# Patient Record
Sex: Female | Born: 1970 | Race: Black or African American | Hispanic: No | Smoking: Never smoker
Health system: Southern US, Community
[De-identification: ages and names within clinical notes are randomized; demographics above are authoritative.]

## PROBLEM LIST (undated history)

## (undated) DIAGNOSIS — M79606 Pain in leg, unspecified: Secondary | ICD-10-CM

## (undated) DIAGNOSIS — F32A Depression, unspecified: Secondary | ICD-10-CM

## (undated) DIAGNOSIS — N6082 Other benign mammary dysplasias of left breast: Secondary | ICD-10-CM

## (undated) DIAGNOSIS — M549 Dorsalgia, unspecified: Secondary | ICD-10-CM

## (undated) DIAGNOSIS — F419 Anxiety disorder, unspecified: Secondary | ICD-10-CM

## (undated) DIAGNOSIS — E010 Iodine-deficiency related diffuse (endemic) goiter: Secondary | ICD-10-CM

## (undated) DIAGNOSIS — R0602 Shortness of breath: Secondary | ICD-10-CM

## (undated) DIAGNOSIS — M7989 Other specified soft tissue disorders: Secondary | ICD-10-CM

## (undated) DIAGNOSIS — R0989 Other specified symptoms and signs involving the circulatory and respiratory systems: Secondary | ICD-10-CM

## (undated) DIAGNOSIS — M069 Rheumatoid arthritis, unspecified: Secondary | ICD-10-CM

## (undated) DIAGNOSIS — E739 Lactose intolerance, unspecified: Secondary | ICD-10-CM

## (undated) DIAGNOSIS — R5383 Other fatigue: Secondary | ICD-10-CM

## (undated) DIAGNOSIS — R413 Other amnesia: Secondary | ICD-10-CM

## (undated) DIAGNOSIS — F329 Major depressive disorder, single episode, unspecified: Secondary | ICD-10-CM

## (undated) DIAGNOSIS — I10 Essential (primary) hypertension: Secondary | ICD-10-CM

## (undated) DIAGNOSIS — D649 Anemia, unspecified: Secondary | ICD-10-CM

## (undated) DIAGNOSIS — M545 Low back pain, unspecified: Secondary | ICD-10-CM

## (undated) DIAGNOSIS — R7303 Prediabetes: Secondary | ICD-10-CM

## (undated) HISTORY — DX: Low back pain, unspecified: M54.50

## (undated) HISTORY — DX: Lactose intolerance, unspecified: E73.9

## (undated) HISTORY — DX: Iodine-deficiency related diffuse (endemic) goiter: E01.0

## (undated) HISTORY — DX: Dorsalgia, unspecified: M54.9

## (undated) HISTORY — DX: Other specified symptoms and signs involving the circulatory and respiratory systems: R09.89

## (undated) HISTORY — DX: Other fatigue: R53.83

## (undated) HISTORY — DX: Anxiety disorder, unspecified: F41.9

## (undated) HISTORY — DX: Other specified soft tissue disorders: M79.89

## (undated) HISTORY — PX: BACK SURGERY: SHX140

## (undated) HISTORY — DX: Rheumatoid arthritis, unspecified: M06.9

## (undated) HISTORY — DX: Shortness of breath: R06.02

## (undated) HISTORY — DX: Other amnesia: R41.3

## (undated) HISTORY — DX: Essential (primary) hypertension: I10

## (undated) HISTORY — DX: Pain in leg, unspecified: M79.606

---

## 1998-07-19 ENCOUNTER — Inpatient Hospital Stay (HOSPITAL_COMMUNITY): Admission: AD | Admit: 1998-07-19 | Discharge: 1998-07-19 | Payer: Self-pay | Admitting: Obstetrics and Gynecology

## 1998-07-25 ENCOUNTER — Inpatient Hospital Stay (HOSPITAL_COMMUNITY): Admission: AD | Admit: 1998-07-25 | Discharge: 1998-07-25 | Payer: Self-pay | Admitting: Obstetrics and Gynecology

## 1998-07-27 ENCOUNTER — Inpatient Hospital Stay (HOSPITAL_COMMUNITY): Admission: AD | Admit: 1998-07-27 | Discharge: 1998-07-27 | Payer: Self-pay | Admitting: Obstetrics and Gynecology

## 1998-08-21 ENCOUNTER — Inpatient Hospital Stay (HOSPITAL_COMMUNITY): Admission: AD | Admit: 1998-08-21 | Discharge: 1998-08-23 | Payer: Self-pay | Admitting: Obstetrics and Gynecology

## 1998-08-23 ENCOUNTER — Encounter (HOSPITAL_COMMUNITY): Admission: RE | Admit: 1998-08-23 | Discharge: 1998-11-21 | Payer: Self-pay | Admitting: Obstetrics and Gynecology

## 1998-08-26 ENCOUNTER — Ambulatory Visit (HOSPITAL_COMMUNITY): Admission: RE | Admit: 1998-08-26 | Discharge: 1998-08-26 | Payer: Self-pay | Admitting: Obstetrics and Gynecology

## 1998-09-24 ENCOUNTER — Other Ambulatory Visit: Admission: RE | Admit: 1998-09-24 | Discharge: 1998-09-24 | Payer: Self-pay | Admitting: Obstetrics and Gynecology

## 1998-11-24 ENCOUNTER — Encounter (HOSPITAL_COMMUNITY): Admission: RE | Admit: 1998-11-24 | Discharge: 1999-02-22 | Payer: Self-pay

## 2000-05-10 ENCOUNTER — Other Ambulatory Visit: Admission: RE | Admit: 2000-05-10 | Discharge: 2000-05-10 | Payer: Self-pay | Admitting: Obstetrics and Gynecology

## 2001-03-13 ENCOUNTER — Other Ambulatory Visit: Admission: RE | Admit: 2001-03-13 | Discharge: 2001-03-13 | Payer: Self-pay | Admitting: Obstetrics and Gynecology

## 2001-10-11 ENCOUNTER — Inpatient Hospital Stay (HOSPITAL_COMMUNITY): Admission: AD | Admit: 2001-10-11 | Discharge: 2001-10-13 | Payer: Self-pay | Admitting: Obstetrics and Gynecology

## 2002-01-15 ENCOUNTER — Other Ambulatory Visit: Admission: RE | Admit: 2002-01-15 | Discharge: 2002-01-15 | Payer: Self-pay | Admitting: Obstetrics and Gynecology

## 2003-04-14 ENCOUNTER — Other Ambulatory Visit: Admission: RE | Admit: 2003-04-14 | Discharge: 2003-04-14 | Payer: Self-pay | Admitting: Obstetrics and Gynecology

## 2004-07-05 ENCOUNTER — Other Ambulatory Visit: Admission: RE | Admit: 2004-07-05 | Discharge: 2004-07-05 | Payer: Self-pay | Admitting: Obstetrics and Gynecology

## 2005-10-09 ENCOUNTER — Other Ambulatory Visit: Admission: RE | Admit: 2005-10-09 | Discharge: 2005-10-09 | Payer: Self-pay | Admitting: Obstetrics and Gynecology

## 2006-09-01 ENCOUNTER — Emergency Department (HOSPITAL_COMMUNITY): Admission: EM | Admit: 2006-09-01 | Discharge: 2006-09-02 | Payer: Self-pay | Admitting: Emergency Medicine

## 2009-04-12 ENCOUNTER — Ambulatory Visit: Payer: Self-pay | Admitting: Vascular Surgery

## 2009-04-12 ENCOUNTER — Ambulatory Visit (HOSPITAL_COMMUNITY): Admission: RE | Admit: 2009-04-12 | Discharge: 2009-04-12 | Payer: Self-pay | Admitting: Obstetrics and Gynecology

## 2009-04-12 ENCOUNTER — Encounter (INDEPENDENT_AMBULATORY_CARE_PROVIDER_SITE_OTHER): Payer: Self-pay | Admitting: Obstetrics and Gynecology

## 2011-07-26 ENCOUNTER — Inpatient Hospital Stay (INDEPENDENT_AMBULATORY_CARE_PROVIDER_SITE_OTHER)
Admission: RE | Admit: 2011-07-26 | Discharge: 2011-07-26 | Disposition: A | Discharge status: Home / Self Care | Payer: BC Managed Care – PPO | Source: Ambulatory Visit | Attending: Family Medicine | Admitting: Family Medicine

## 2011-07-26 ENCOUNTER — Ambulatory Visit (INDEPENDENT_AMBULATORY_CARE_PROVIDER_SITE_OTHER): Payer: BC Managed Care – PPO

## 2011-07-26 DIAGNOSIS — S93409A Sprain of unspecified ligament of unspecified ankle, initial encounter: Secondary | ICD-10-CM

## 2013-04-24 ENCOUNTER — Other Ambulatory Visit: Payer: Self-pay | Admitting: Family Medicine

## 2013-04-24 DIAGNOSIS — E049 Nontoxic goiter, unspecified: Secondary | ICD-10-CM

## 2013-04-25 ENCOUNTER — Ambulatory Visit
Admission: RE | Admit: 2013-04-25 | Discharge: 2013-04-25 | Disposition: A | Discharge status: Home / Self Care | Payer: BC Managed Care – PPO | Source: Ambulatory Visit | Attending: Family Medicine | Admitting: Family Medicine

## 2013-04-25 ENCOUNTER — Other Ambulatory Visit: Payer: BC Managed Care – PPO

## 2013-04-25 DIAGNOSIS — E049 Nontoxic goiter, unspecified: Secondary | ICD-10-CM

## 2013-05-06 ENCOUNTER — Other Ambulatory Visit: Payer: Self-pay | Admitting: *Deleted

## 2013-05-06 ENCOUNTER — Encounter: Payer: Self-pay | Admitting: Surgery

## 2013-05-06 DIAGNOSIS — M79609 Pain in unspecified limb: Secondary | ICD-10-CM

## 2013-05-23 ENCOUNTER — Encounter: Payer: Self-pay | Admitting: Surgery

## 2013-05-26 ENCOUNTER — Encounter: Payer: BC Managed Care – PPO | Admitting: Surgery

## 2017-02-01 ENCOUNTER — Ambulatory Visit
Admission: RE | Admit: 2017-02-01 | Discharge: 2017-02-01 | Disposition: A | Discharge status: Home / Self Care | Payer: BC Managed Care – PPO | Source: Ambulatory Visit | Attending: Physician Assistant | Admitting: Physician Assistant

## 2017-02-01 ENCOUNTER — Other Ambulatory Visit: Payer: Self-pay | Admitting: Physician Assistant

## 2017-02-01 DIAGNOSIS — R079 Chest pain, unspecified: Secondary | ICD-10-CM

## 2017-07-25 ENCOUNTER — Other Ambulatory Visit: Payer: Self-pay | Admitting: Family Medicine

## 2017-07-25 DIAGNOSIS — M48061 Spinal stenosis, lumbar region without neurogenic claudication: Secondary | ICD-10-CM

## 2017-08-02 ENCOUNTER — Ambulatory Visit
Admission: RE | Admit: 2017-08-02 | Discharge: 2017-08-02 | Disposition: A | Discharge status: Home / Self Care | Payer: BC Managed Care – PPO | Source: Ambulatory Visit | Attending: Family Medicine | Admitting: Family Medicine

## 2017-08-02 DIAGNOSIS — M48061 Spinal stenosis, lumbar region without neurogenic claudication: Secondary | ICD-10-CM

## 2017-08-04 ENCOUNTER — Other Ambulatory Visit: Payer: BC Managed Care – PPO

## 2018-06-05 ENCOUNTER — Other Ambulatory Visit: Payer: Self-pay | Admitting: General Surgery

## 2018-06-21 ENCOUNTER — Encounter (HOSPITAL_BASED_OUTPATIENT_CLINIC_OR_DEPARTMENT_OTHER): Payer: Self-pay | Admitting: *Deleted

## 2018-06-21 ENCOUNTER — Other Ambulatory Visit: Payer: Self-pay

## 2018-06-26 ENCOUNTER — Encounter (HOSPITAL_BASED_OUTPATIENT_CLINIC_OR_DEPARTMENT_OTHER)
Admission: RE | Admit: 2018-06-26 | Discharge: 2018-06-26 | Disposition: A | Discharge status: Home / Self Care | Payer: BC Managed Care – PPO | Source: Ambulatory Visit | Attending: General Surgery | Admitting: General Surgery

## 2018-06-26 ENCOUNTER — Other Ambulatory Visit: Payer: Self-pay

## 2018-06-26 DIAGNOSIS — Z01810 Encounter for preprocedural cardiovascular examination: Secondary | ICD-10-CM | POA: Insufficient documentation

## 2018-06-26 DIAGNOSIS — Z01812 Encounter for preprocedural laboratory examination: Secondary | ICD-10-CM | POA: Diagnosis present

## 2018-06-26 LAB — BASIC METABOLIC PANEL
Anion gap: 8 (ref 5–15)
BUN: 10 mg/dL (ref 6–20)
CHLORIDE: 104 mmol/L (ref 98–111)
CO2: 27 mmol/L (ref 22–32)
CREATININE: 0.91 mg/dL (ref 0.44–1.00)
Calcium: 9.8 mg/dL (ref 8.9–10.3)
GFR calc Af Amer: >60 mL/min (ref >60)
GFR calc non Af Amer: >60 mL/min (ref >60)
GLUCOSE: 77 mg/dL (ref 70–99)
Potassium: 3.9 mmol/L (ref 3.5–5.1)
Sodium: 139 mmol/L (ref 135–145)

## 2018-06-26 NOTE — Progress Notes (Signed)
Preop EKG and BMET completed as per order. Pt tolerated procedure well.  Instructions given to drink the given ensure by 0415 hrs the day of surgery. Patient verbalized understanding.

## 2018-07-01 NOTE — Anesthesia Preprocedure Evaluation (Signed)
Anesthesia Evaluation  Patient identified by MRN, date of birth, ID band Patient awake    Reviewed: Allergy & Precautions, H&P , NPO status , Patient's Chart, lab work & pertinent test results, reviewed documented beta blocker date and time   Airway Mallampati: II  TM Distance: >3 FB Neck ROM: full    Dental no notable dental hx.    Pulmonary neg pulmonary ROS,    Pulmonary exam normal breath sounds clear to auscultation       Cardiovascular Exercise Tolerance: Good hypertension, Pt. on medications negative cardio ROS   Rhythm:regular Rate:Normal     Neuro/Psych negative neurological ROS  negative psych ROS   GI/Hepatic negative GI ROS, Neg liver ROS,   Endo/Other  negative endocrine ROS  Renal/GU negative Renal ROS  negative genitourinary   Musculoskeletal   Abdominal   Peds  Hematology negative hematology ROS (+)   Anesthesia Other Findings   Reproductive/Obstetrics negative OB ROS                            Anesthesia Physical Anesthesia Plan  ASA: II  Anesthesia Plan: General   Post-op Pain Management:    Induction: Intravenous  PONV Risk Score and Plan: 3 and Ondansetron, Dexamethasone and Treatment may vary due to age or medical condition  Airway Management Planned: LMA and Oral ETT  Additional Equipment:   Intra-op Plan:   Post-operative Plan: Extubation in OR  Informed Consent: I have reviewed the patients History and Physical, chart, labs and discussed the procedure including the risks, benefits and alternatives for the proposed anesthesia with the patient or authorized representative who has indicated his/her understanding and acceptance.   Dental Advisory Given  Plan Discussed with: CRNA, Surgeon and Anesthesiologist  Anesthesia Plan Comments: ( )        Anesthesia Quick Evaluation

## 2018-07-02 ENCOUNTER — Ambulatory Visit (HOSPITAL_BASED_OUTPATIENT_CLINIC_OR_DEPARTMENT_OTHER)
Admission: RE | Admit: 2018-07-02 | Discharge: 2018-07-02 | Disposition: A | Discharge status: Home / Self Care | Payer: BC Managed Care – PPO | Source: Ambulatory Visit | Attending: General Surgery | Admitting: General Surgery

## 2018-07-02 ENCOUNTER — Encounter (HOSPITAL_BASED_OUTPATIENT_CLINIC_OR_DEPARTMENT_OTHER): Payer: Self-pay

## 2018-07-02 ENCOUNTER — Ambulatory Visit (HOSPITAL_BASED_OUTPATIENT_CLINIC_OR_DEPARTMENT_OTHER): Payer: BC Managed Care – PPO | Admitting: Anesthesiology

## 2018-07-02 ENCOUNTER — Other Ambulatory Visit: Payer: Self-pay

## 2018-07-02 ENCOUNTER — Encounter (HOSPITAL_BASED_OUTPATIENT_CLINIC_OR_DEPARTMENT_OTHER)
Admission: RE | Disposition: A | Discharge status: Home / Self Care | Payer: Self-pay | Source: Ambulatory Visit | Attending: General Surgery

## 2018-07-02 DIAGNOSIS — F329 Major depressive disorder, single episode, unspecified: Secondary | ICD-10-CM | POA: Insufficient documentation

## 2018-07-02 DIAGNOSIS — I10 Essential (primary) hypertension: Secondary | ICD-10-CM | POA: Diagnosis not present

## 2018-07-02 DIAGNOSIS — Z888 Allergy status to other drugs, medicaments and biological substances status: Secondary | ICD-10-CM | POA: Insufficient documentation

## 2018-07-02 DIAGNOSIS — N61 Mastitis without abscess: Secondary | ICD-10-CM | POA: Diagnosis not present

## 2018-07-02 DIAGNOSIS — Z880 Allergy status to penicillin: Secondary | ICD-10-CM | POA: Diagnosis not present

## 2018-07-02 DIAGNOSIS — F419 Anxiety disorder, unspecified: Secondary | ICD-10-CM | POA: Diagnosis not present

## 2018-07-02 DIAGNOSIS — Z79899 Other long term (current) drug therapy: Secondary | ICD-10-CM | POA: Diagnosis not present

## 2018-07-02 DIAGNOSIS — N6320 Unspecified lump in the left breast, unspecified quadrant: Secondary | ICD-10-CM | POA: Diagnosis present

## 2018-07-02 HISTORY — DX: Other benign mammary dysplasias of left breast: N60.82

## 2018-07-02 HISTORY — DX: Major depressive disorder, single episode, unspecified: F32.9

## 2018-07-02 HISTORY — DX: Depression, unspecified: F32.A

## 2018-07-02 HISTORY — DX: Anemia, unspecified: D64.9

## 2018-07-02 HISTORY — PX: BREAST CYST EXCISION: SHX579

## 2018-07-02 SURGERY — EXCISION, CYST, BREAST
Anesthesia: General | Site: Breast | Laterality: Left

## 2018-07-02 MED ORDER — PROPOFOL 10 MG/ML IV BOLUS
INTRAVENOUS | Status: AC
  Filled 2018-07-02: qty 20

## 2018-07-02 MED ORDER — ONDANSETRON HCL 4 MG/2ML IJ SOLN
INTRAMUSCULAR | Status: AC
  Filled 2018-07-02: qty 2

## 2018-07-02 MED ORDER — LIDOCAINE HCL (CARDIAC) PF 100 MG/5ML IV SOSY
PREFILLED_SYRINGE | INTRAVENOUS | Status: DC | PRN
  Administered 2018-07-02: 30 mg via INTRAVENOUS

## 2018-07-02 MED ORDER — BACITRACIN 500 UNIT/GM EX OINT
TOPICAL_OINTMENT | CUTANEOUS | Status: DC | PRN
  Administered 2018-07-02: 1 via TOPICAL

## 2018-07-02 MED ORDER — CELECOXIB 400 MG PO CAPS
400.0000 mg | ORAL_CAPSULE | ORAL | Status: AC
  Administered 2018-07-02: 400 mg via ORAL

## 2018-07-02 MED ORDER — DEXAMETHASONE SODIUM PHOSPHATE 10 MG/ML IJ SOLN
INTRAMUSCULAR | Status: AC
  Filled 2018-07-02: qty 1

## 2018-07-02 MED ORDER — CELECOXIB 200 MG PO CAPS
ORAL_CAPSULE | ORAL | Status: AC
  Filled 2018-07-02: qty 2

## 2018-07-02 MED ORDER — CIPROFLOXACIN IN D5W 400 MG/200ML IV SOLN
400.0000 mg | INTRAVENOUS | Status: AC
  Administered 2018-07-02: 400 mg via INTRAVENOUS

## 2018-07-02 MED ORDER — EPHEDRINE SULFATE 50 MG/ML IJ SOLN
INTRAMUSCULAR | Status: DC | PRN
  Administered 2018-07-02 (×2): 25 mg via INTRAVENOUS

## 2018-07-02 MED ORDER — FENTANYL CITRATE (PF) 100 MCG/2ML IJ SOLN
INTRAMUSCULAR | Status: AC
  Filled 2018-07-02: qty 2

## 2018-07-02 MED ORDER — SCOPOLAMINE 1 MG/3DAYS TD PT72: 1.0000 | MEDICATED_PATCH | Freq: Once | TRANSDERMAL | Status: DC | PRN

## 2018-07-02 MED ORDER — MIDAZOLAM HCL 5 MG/5ML IJ SOLN
INTRAMUSCULAR | Status: DC | PRN
  Administered 2018-07-02: 2 mg via INTRAVENOUS

## 2018-07-02 MED ORDER — ACETAMINOPHEN 325 MG PO TABS: 325.0000 mg | ORAL_TABLET | ORAL | Status: DC | PRN

## 2018-07-02 MED ORDER — GABAPENTIN 100 MG PO CAPS
100.0000 mg | ORAL_CAPSULE | ORAL | Status: AC
  Administered 2018-07-02: 100 mg via ORAL

## 2018-07-02 MED ORDER — ACETAMINOPHEN 160 MG/5ML PO SOLN: 325.0000 mg | ORAL | Status: DC | PRN

## 2018-07-02 MED ORDER — OXYCODONE HCL 5 MG/5ML PO SOLN: 5.0000 mg | Freq: Once | ORAL | Status: DC | PRN

## 2018-07-02 MED ORDER — OXYCODONE HCL 5 MG PO TABS: 5.0000 mg | ORAL_TABLET | Freq: Once | ORAL | Status: DC | PRN

## 2018-07-02 MED ORDER — SODIUM CHLORIDE 0.9 % IJ SOLN
INTRAMUSCULAR | Status: AC
  Filled 2018-07-02: qty 20

## 2018-07-02 MED ORDER — BUPIVACAINE HCL (PF) 0.25 % IJ SOLN
INTRAMUSCULAR | Status: DC | PRN
  Administered 2018-07-02: 10 mL

## 2018-07-02 MED ORDER — PROPOFOL 10 MG/ML IV BOLUS
INTRAVENOUS | Status: DC | PRN
  Administered 2018-07-02: 150 mg via INTRAVENOUS

## 2018-07-02 MED ORDER — ACETAMINOPHEN 500 MG PO TABS
1000.0000 mg | ORAL_TABLET | ORAL | Status: AC
  Administered 2018-07-02: 1000 mg via ORAL

## 2018-07-02 MED ORDER — FENTANYL CITRATE (PF) 100 MCG/2ML IJ SOLN: 25.0000 ug | INTRAMUSCULAR | Status: DC | PRN

## 2018-07-02 MED ORDER — ACETAMINOPHEN 500 MG PO TABS
ORAL_TABLET | ORAL | Status: AC
  Filled 2018-07-02: qty 2

## 2018-07-02 MED ORDER — MIDAZOLAM HCL 2 MG/2ML IJ SOLN: 1.0000 mg | INTRAMUSCULAR | Status: DC | PRN

## 2018-07-02 MED ORDER — LIDOCAINE HCL (CARDIAC) PF 100 MG/5ML IV SOSY
PREFILLED_SYRINGE | INTRAVENOUS | Status: AC
  Filled 2018-07-02: qty 5

## 2018-07-02 MED ORDER — DEXAMETHASONE SODIUM PHOSPHATE 4 MG/ML IJ SOLN
INTRAMUSCULAR | Status: DC | PRN
  Administered 2018-07-02: 10 mg via INTRAVENOUS

## 2018-07-02 MED ORDER — BUPIVACAINE HCL (PF) 0.25 % IJ SOLN
INTRAMUSCULAR | Status: AC
  Filled 2018-07-02: qty 30

## 2018-07-02 MED ORDER — MEPERIDINE HCL 25 MG/ML IJ SOLN: 6.2500 mg | INTRAMUSCULAR | Status: DC | PRN

## 2018-07-02 MED ORDER — CHLORHEXIDINE GLUCONATE CLOTH 2 % EX PADS: 6.0000 | MEDICATED_PAD | Freq: Once | CUTANEOUS | Status: DC

## 2018-07-02 MED ORDER — ONDANSETRON HCL 4 MG/2ML IJ SOLN
INTRAMUSCULAR | Status: DC | PRN
  Administered 2018-07-02: 4 mg via INTRAVENOUS

## 2018-07-02 MED ORDER — EPHEDRINE SULFATE 50 MG/ML IJ SOLN
INTRAMUSCULAR | Status: AC
  Filled 2018-07-02: qty 1

## 2018-07-02 MED ORDER — FENTANYL CITRATE (PF) 100 MCG/2ML IJ SOLN: 50.0000 ug | INTRAMUSCULAR | Status: DC | PRN

## 2018-07-02 MED ORDER — FENTANYL CITRATE (PF) 100 MCG/2ML IJ SOLN
INTRAMUSCULAR | Status: DC | PRN
  Administered 2018-07-02: 100 ug via INTRAVENOUS

## 2018-07-02 MED ORDER — BACITRACIN ZINC 500 UNIT/GM EX OINT
TOPICAL_OINTMENT | CUTANEOUS | Status: AC
  Filled 2018-07-02: qty 0.9

## 2018-07-02 MED ORDER — ONDANSETRON HCL 4 MG/2ML IJ SOLN: 4.0000 mg | Freq: Once | INTRAMUSCULAR | Status: DC | PRN

## 2018-07-02 MED ORDER — GABAPENTIN 100 MG PO CAPS
ORAL_CAPSULE | ORAL | Status: AC
Start: 2018-07-02 — End: ?
  Filled 2018-07-02: qty 1

## 2018-07-02 MED ORDER — MIDAZOLAM HCL 2 MG/2ML IJ SOLN
INTRAMUSCULAR | Status: AC
  Filled 2018-07-02: qty 2

## 2018-07-02 MED ORDER — ENSURE PRE-SURGERY PO LIQD: 296.0000 mL | Freq: Once | ORAL | Status: DC

## 2018-07-02 MED ORDER — CIPROFLOXACIN IN D5W 400 MG/200ML IV SOLN
INTRAVENOUS | Status: AC
  Filled 2018-07-02: qty 200

## 2018-07-02 MED ORDER — LACTATED RINGERS IV SOLN
INTRAVENOUS | Status: DC
  Administered 2018-07-02: 07:00:00 via INTRAVENOUS

## 2018-07-02 SURGICAL SUPPLY — 57 items
ADH SKN CLS APL DERMABOND .7 (GAUZE/BANDAGES/DRESSINGS)
BINDER BREAST LRG (GAUZE/BANDAGES/DRESSINGS) IMPLANT
BINDER BREAST MEDIUM (GAUZE/BANDAGES/DRESSINGS) IMPLANT
BINDER BREAST XLRG (GAUZE/BANDAGES/DRESSINGS) IMPLANT
BINDER BREAST XXLRG (GAUZE/BANDAGES/DRESSINGS) ×2 IMPLANT
BLADE SURG 15 STRL LF DISP TIS (BLADE) ×1 IMPLANT
BLADE SURG 15 STRL SS (BLADE) ×3
CANISTER SUCT 1200ML W/VALVE (MISCELLANEOUS) IMPLANT
CHLORAPREP W/TINT 26ML (MISCELLANEOUS) ×3 IMPLANT
CLIP VESOCCLUDE SM WIDE 6/CT (CLIP) IMPLANT
CLOSURE WOUND 1/2 X4 (GAUZE/BANDAGES/DRESSINGS) ×1
COVER BACK TABLE 60X90IN (DRAPES) ×3 IMPLANT
COVER MAYO STAND STRL (DRAPES) ×3 IMPLANT
DECANTER SPIKE VIAL GLASS SM (MISCELLANEOUS) ×2 IMPLANT
DERMABOND ADVANCED (GAUZE/BANDAGES/DRESSINGS)
DERMABOND ADVANCED .7 DNX12 (GAUZE/BANDAGES/DRESSINGS) IMPLANT
DEVICE DUBIN W/COMP PLATE 8390 (MISCELLANEOUS) IMPLANT
DRAPE LAPAROSCOPIC ABDOMINAL (DRAPES) ×3 IMPLANT
DRSG TEGADERM 4X4.75 (GAUZE/BANDAGES/DRESSINGS) ×3 IMPLANT
ELECT COATED BLADE 2.86 ST (ELECTRODE) ×3 IMPLANT
ELECT REM PT RETURN 9FT ADLT (ELECTROSURGICAL) ×3
ELECTRODE REM PT RTRN 9FT ADLT (ELECTROSURGICAL) ×1 IMPLANT
GAUZE SPONGE 4X4 12PLY STRL LF (GAUZE/BANDAGES/DRESSINGS) ×3 IMPLANT
GLOVE BIO SURGEON STRL SZ7 (GLOVE) ×3 IMPLANT
GLOVE BIOGEL PI IND STRL 7.0 (GLOVE) IMPLANT
GLOVE BIOGEL PI IND STRL 7.5 (GLOVE) ×1 IMPLANT
GLOVE BIOGEL PI INDICATOR 7.0 (GLOVE) ×2
GLOVE BIOGEL PI INDICATOR 7.5 (GLOVE) ×2
GLOVE ECLIPSE 6.5 STRL STRAW (GLOVE) ×2 IMPLANT
GOWN STRL REUS W/ TWL LRG LVL3 (GOWN DISPOSABLE) ×3 IMPLANT
GOWN STRL REUS W/TWL LRG LVL3 (GOWN DISPOSABLE) ×6
ILLUMINATOR WAVEGUIDE N/F (MISCELLANEOUS) IMPLANT
LIGHT WAVEGUIDE WIDE FLAT (MISCELLANEOUS) IMPLANT
NDL HYPO 25X1 1.5 SAFETY (NEEDLE) ×1 IMPLANT
NEEDLE HYPO 25X1 1.5 SAFETY (NEEDLE) ×3 IMPLANT
NS IRRIG 1000ML POUR BTL (IV SOLUTION) ×2 IMPLANT
PACK BASIN DAY SURGERY FS (CUSTOM PROCEDURE TRAY) ×3 IMPLANT
PENCIL BUTTON HOLSTER BLD 10FT (ELECTRODE) ×3 IMPLANT
SLEEVE SCD COMPRESS KNEE MED (MISCELLANEOUS) ×3 IMPLANT
SPONGE LAP 4X18 RFD (DISPOSABLE) ×3 IMPLANT
STRIP CLOSURE SKIN 1/2X4 (GAUZE/BANDAGES/DRESSINGS) ×2 IMPLANT
SUT ETHILON 4 0 PS 2 18 (SUTURE) ×2 IMPLANT
SUT MNCRL AB 4-0 PS2 18 (SUTURE) IMPLANT
SUT MON AB 5-0 PS2 18 (SUTURE) IMPLANT
SUT SILK 2 0 SH (SUTURE) ×3 IMPLANT
SUT VIC AB 2-0 SH 27 (SUTURE) ×3
SUT VIC AB 2-0 SH 27XBRD (SUTURE) ×1 IMPLANT
SUT VIC AB 3-0 SH 27 (SUTURE) ×3
SUT VIC AB 3-0 SH 27X BRD (SUTURE) ×1 IMPLANT
SUT VIC AB 5-0 PS2 18 (SUTURE) IMPLANT
SUT VICRYL AB 3 0 TIES (SUTURE) IMPLANT
SYR CONTROL 10ML LL (SYRINGE) ×3 IMPLANT
TOWEL GREEN STERILE FF (TOWEL DISPOSABLE) ×3 IMPLANT
TOWEL OR NON WOVEN STRL DISP B (DISPOSABLE) ×3 IMPLANT
TUBE CONNECTING 20'X1/4 (TUBING)
TUBE CONNECTING 20X1/4 (TUBING) IMPLANT
YANKAUER SUCT BULB TIP NO VENT (SUCTIONS) IMPLANT

## 2018-07-02 NOTE — Op Note (Signed)
Preoperative diagnosis: Left breast mass, likely prior infected sebaceous cyst Postoperative diagnosis: Same as above Procedure: Excisional biopsy of left breast mass, likely sebaceous cyst Surgeon: Dr. Harden MoMatt Alyana Kreiter Anesthesia: General per patient choice Specimens: Left breast skin and tissue marked short stitch superior, long stitch lateral Estimated blood loss: Minimal Complications: None Drains: None Sponge  and needle count correct at completion disposition to recovery in stable condition  Indications: This is a healthy 47 year old female who presents with a left breast mass that has been previously infected.  Is doing better now but we discussed going to the operating room excision of what appeared to be a left breast sebaceous cyst.  Procedure: After informed consent was obtained the patient was taken to the operating room.  She desired general anesthesia.  She was given antibiotics.  SCDs were placed.  She was then prepped and draped in the standard sterile surgical fashion.  A surgical timeout was then performed.  She and I had both marked this area prior to beginning.  I made an elliptical incision after infiltrating with local anesthetic.  This included the connection to the skin that I can see.  I then excised the entire mass which appeared to be a sebaceous cyst down to healthy tissue.  This was marked as above and passed off the table.  I then obtained hemostasis.  I closed the deep layer with 3-0 Vicryl and I closed the skin with 4-0- external nylon sutures.  I placed bacitracin and sterile dressing.  She tolerated this well was extubated and transferred to recovery stable.

## 2018-07-02 NOTE — H&P (Signed)
Heather Lee is an 47 y.o. female.   Chief Complaint: left breast cyst HPI: [age] yof with left breast sebaceous cyst. Prior history infection and drainage.  Would like excision  Past Medical History:  Diagnosis Date  . Anemia   . Anxiety   . Depression   . Hypertension   . Leg pain   . Sebaceous cyst of breast, left   . Thyromegaly     Past Surgical History:  Procedure Laterality Date  . BACK SURGERY      History reviewed. No pertinent family history. Social History:  reports that she has never smoked. She has never used smokeless tobacco. She reports that she drinks alcohol. She reports that she does not use drugs.  Allergies:  Allergies  Allergen Reactions  . Ace Inhibitors Other (See Comments)    cough  . Penicillins Hives    Medications Prior to Admission  Medication Sig Dispense Refill  . ferrous sulfate 325 (65 FE) MG EC tablet Take 325 mg by mouth 3 (three) times daily with meals.    . Fluoxetine HCl, PMDD, 20 MG CAPS Take by mouth.    . hydrochlorothiazide (MICROZIDE) 12.5 MG capsule Take 12.5 mg by mouth daily.    . Multiple Vitamin (MULTIVITAMIN WITH MINERALS) TABS tablet Take 1 tablet by mouth daily.      No results found for this or any previous visit (from the past 48 hour(s)). No results found.  Review of Systems  All other systems reviewed and are negative.   Blood pressure 118/86, pulse 68, temperature 98.4 F (36.9 C), temperature source Oral, resp. rate 18, height 5\' 7"  (1.702 m), weight 98.9 kg (218 lb), last menstrual period 06/07/2018, SpO2 100 %. Physical Exam  cv rrr pulm clear Left breast 2x2 cm area discoloration and mass  Assessment/Plan Excision of left breast sebaceous cyst  Emelia LoronMatthew Tytionna Cloyd, MD 07/02/2018, 7:15 AM

## 2018-07-02 NOTE — Transfer of Care (Signed)
Immediate Anesthesia Transfer of Care Note  Patient: Heather Lee  Procedure(s) Performed: LEFT BREAST SEBACEOUS CYST EXCISION (Left Breast)  Patient Location: PACU  Anesthesia Type:General  Level of Consciousness: sedated  Airway & Oxygen Therapy: Patient Spontanous Breathing and Patient connected to face mask oxygen  Post-op Assessment: Report given to RN and Post -op Vital signs reviewed and stable  Post vital signs: Reviewed and stable  Last Vitals:  Vitals Value Taken Time  BP 112/73 07/02/2018  8:06 AM  Temp    Pulse 76 07/02/2018  8:08 AM  Resp 13 07/02/2018  8:08 AM  SpO2 100 % 07/02/2018  8:08 AM  Vitals shown include unvalidated device data.  Last Pain:  Vitals:   07/02/18 0642  TempSrc: Oral  PainSc: 0-No pain         Complications: No apparent anesthesia complications

## 2018-07-02 NOTE — Discharge Instructions (Signed)
Post Anesthesia Home Care Instructions  Activity: Get plenty of rest for the remainder of the day. A responsible individual must stay with you for 24 hours following the procedure.  For the next 24 hours, DO NOT: -Drive a car -Advertising copywriter -Drink alcoholic beverages -Take any medication unless instructed by your physician -Make any legal decisions or sign important papers.  Meals: Start with liquid foods such as gelatin or soup. Progress to regular foods as tolerated. Avoid greasy, spicy, heavy foods. If nausea and/or vomiting occur, drink only clear liquids until the nausea and/or vomiting subsides. Call your physician if vomiting continues.  Special Instructions/Symptoms: Your throat may feel dry or sore from the anesthesia or the breathing tube placed in your throat during surgery. If this causes discomfort, gargle with warm salt water. The discomfort should disappear within 24 hours.  If you had a scopolamine patch placed behind your ear for the management of post- operative nausea and/or vomiting:  1. The medication in the patch is effective for 72 hours, after which it should be removed.  Wrap patch in a tissue and discard in the trash. Wash hands thoroughly with soap and water. 2. You may remove the patch earlier than 72 hours if you experience unpleasant side effects which may include dry mouth, dizziness or visual disturbances. 3. Avoid touching the patch. Wash your hands with soap and water after contact with the patch.   [facility]   POST OP INSTRUCTIONS  Always review your discharge instruction sheet given to you by the facility where your surgery was performed. IF YOU HAVE DISABILITY OR FAMILY LEAVE FORMS, YOU MUST BRING THEM TO THE OFFICE FOR PROCESSING.   DO NOT GIVE THEM TO YOUR DOCTOR.  1. Take acetaminophen (Tylenol), naprosyn (Alleve) or ibuprofen (Advil) as needed. 2. Take your usually prescribed medications unless otherwise  directed. 3. You should follow a light diet the first 24 hours after arrival home, such as soup and crackers, etc.  Be sure to include lots of fluids daily.  Resume your normal diet the day after surgery. 4. Most patients will experience some swelling and bruising.  Ice packs  will help.  Swelling and bruising can take several days to resolve.  5. Unless discharge instructions indicate otherwise, you may remove your bandages 48 hours after surgery, and you may shower at that time.  You may have steri-strips (small skin tapes) in place directly over the incision.  These strips should be left on the skin for 7-10 days and will come off on their own.  If your surgeon used skin glue on the incision, you may shower in 24 hours.  The glue will flake off over the next 2-3 weeks.  Any sutures or staples will be removed at the office during your follow-up visit. 6. ACTIVITIES:  You may resume regular (light) daily activities beginning the next day--such as daily self-care, walking, climbing stairs--gradually increasing activities as tolerated.  You may have sexual intercourse when it is comfortable.  Refrain from any heavy lifting or straining until approved by your doctor. a. You may drive when you are no longer taking prescription pain medication, you can comfortably wear a seatbelt, and you can safely maneuver your car and apply brakes. b. RETURN TO WORK:  __________________________________________________________ 7. You should see your doctor in the office for a follow-up appointment approximately 2-3 weeks after your surgery.  Make sure that you call for this appointment within a day or two after you arrive home  to insure a convenient appointment time. 8. OTHER INSTRUCTIONS:  __________________________________________________________________________________________________________________________________________________________________________________________  WHEN TO CALL YOUR DOCTOR: 1. Fever over  101.0 2. Nausea and/or vomiting 3. Extreme swelling or bruising 4. Continued bleeding from incision. 5. Increased pain, redness, or drainage from the incision  The clinic staff is available to answer your questions during regular business hours.  Please dont hesitate to call and ask to speak to one of the nurses for clinical concerns.  If you have a medical emergency, go to the nearest emergency room or call 911.  A surgeon from [facility] is always on call at the hospital   [address], [address], [place], [place]  [address] ?  [address] [address], [place]   [id number] [phone] ? [phone] ? FAX [phone]) [phone] Web site: [email or web address]   Post Anesthesia Home Care Instructions  Activity: Get plenty of rest for the remainder of the day. A responsible individual must stay with you for 24 hours following the procedure.  For the next 24 hours, DO NOT: -Drive a car -Advertising copywriterperate machinery -Drink alcoholic beverages -Take any medication unless instructed by your physician -Make any legal decisions or sign important papers.  Meals: Start with liquid foods such as gelatin or soup. Progress to regular foods as tolerated. Avoid greasy, spicy, heavy foods. If nausea and/or vomiting occur, drink only clear liquids until the nausea and/or vomiting subsides. Call your physician if vomiting continues.  Special Instructions/Symptoms: Your throat may feel dry or sore from the anesthesia or the breathing tube placed in your throat during surgery. If this causes discomfort, gargle with warm salt water. The discomfort should disappear within 24 hours.  If you had a scopolamine patch placed behind your ear for the management of post- operative nausea and/or vomiting:  1. The medication in the patch is effective for 72 hours, after which it should be removed.  Wrap patch in a tissue and discard in the trash. Wash hands thoroughly with soap and water. 2. You may  remove the patch earlier than 72 hours if you experience unpleasant side effects which may include dry mouth, dizziness or visual disturbances. 3. Avoid touching the patch. Wash your hands with soap and water after contact with the patch.

## 2018-07-02 NOTE — Anesthesia Postprocedure Evaluation (Signed)
Anesthesia Post Note  Patient: Heather Lee  Procedure(s) Performed: LEFT BREAST SEBACEOUS CYST EXCISION (Left Breast)     Patient location during evaluation: PACU Anesthesia Type: General Level of consciousness: awake and alert Pain management: pain level controlled Vital Signs Assessment: post-procedure vital signs reviewed and stable Respiratory status: spontaneous breathing, nonlabored ventilation, respiratory function stable and patient connected to nasal cannula oxygen Cardiovascular status: blood pressure returned to baseline and stable Postop Assessment: no apparent nausea or vomiting Anesthetic complications: no    Last Vitals:  Vitals:   07/02/18 0830 07/02/18 0848  BP:  134/87  Pulse: 75 69  Resp: 13 16  Temp:  37.1 C  SpO2: 100% 100%    Last Pain:  Vitals:   07/02/18 0848  TempSrc:   PainSc: 0-No pain                 Vlad Mayberry

## 2018-07-02 NOTE — Anesthesia Procedure Notes (Signed)
Procedure Name: LMA Insertion Date/Time: 07/02/2018 7:34 AM Performed by: Bastrop DesanctisLinka, Cortasia Screws L, CRNA Pre-anesthesia Checklist: Patient identified, Emergency Drugs available, Suction available, Patient being monitored and Timeout performed Patient Re-evaluated:Patient Re-evaluated prior to induction Oxygen Delivery Method: Circle system utilized Preoxygenation: Pre-oxygenation with 100% oxygen Induction Type: IV induction Ventilation: Mask ventilation without difficulty LMA: LMA inserted LMA Size: 4.0 Number of attempts: 1 Airway Equipment and Method: Bite block Placement Confirmation: positive ETCO2 Tube secured with: Tape Dental Injury: Teeth and Oropharynx as per pre-operative assessment

## 2018-07-03 ENCOUNTER — Encounter (HOSPITAL_BASED_OUTPATIENT_CLINIC_OR_DEPARTMENT_OTHER): Payer: Self-pay | Admitting: General Surgery

## 2018-08-31 HISTORY — PX: HYSTEROTOMY: SHX1776

## 2018-09-02 ENCOUNTER — Other Ambulatory Visit: Payer: Self-pay | Admitting: Obstetrics and Gynecology

## 2018-10-25 ENCOUNTER — Other Ambulatory Visit (HOSPITAL_COMMUNITY): Payer: Self-pay | Admitting: Physician Assistant

## 2018-10-25 ENCOUNTER — Ambulatory Visit (HOSPITAL_COMMUNITY)
Admission: RE | Admit: 2018-10-25 | Discharge: 2018-10-25 | Disposition: A | Discharge status: Home / Self Care | Payer: BC Managed Care – PPO | Source: Ambulatory Visit | Attending: Family | Admitting: Family

## 2018-10-25 DIAGNOSIS — M79662 Pain in left lower leg: Secondary | ICD-10-CM | POA: Diagnosis not present

## 2018-11-05 ENCOUNTER — Ambulatory Visit: Payer: Self-pay | Admitting: Allergy and Immunology

## 2018-11-12 ENCOUNTER — Ambulatory Visit: Payer: Self-pay | Admitting: Allergy and Immunology

## 2019-07-18 ENCOUNTER — Other Ambulatory Visit: Payer: Self-pay

## 2019-07-18 DIAGNOSIS — Z20822 Contact with and (suspected) exposure to covid-19: Secondary | ICD-10-CM

## 2019-07-22 LAB — NOVEL CORONAVIRUS, NAA: SARS-CoV-2, NAA: NOT DETECTED

## 2019-12-05 ENCOUNTER — Other Ambulatory Visit: Payer: Self-pay

## 2019-12-05 DIAGNOSIS — Z20822 Contact with and (suspected) exposure to covid-19: Secondary | ICD-10-CM

## 2019-12-07 LAB — NOVEL CORONAVIRUS, NAA: SARS-CoV-2, NAA: NOT DETECTED

## 2020-04-23 ENCOUNTER — Other Ambulatory Visit: Payer: Self-pay | Admitting: General Surgery

## 2020-04-23 DIAGNOSIS — N6082 Other benign mammary dysplasias of left breast: Secondary | ICD-10-CM

## 2020-05-12 ENCOUNTER — Other Ambulatory Visit: Payer: BC Managed Care – PPO

## 2020-08-31 ENCOUNTER — Ambulatory Visit
Admission: RE | Admit: 2020-08-31 | Discharge: 2020-08-31 | Disposition: A | Discharge status: Home / Self Care | Payer: BC Managed Care – PPO | Source: Ambulatory Visit | Attending: General Surgery | Admitting: General Surgery

## 2020-08-31 ENCOUNTER — Other Ambulatory Visit: Payer: Self-pay

## 2020-08-31 DIAGNOSIS — N6082 Other benign mammary dysplasias of left breast: Secondary | ICD-10-CM

## 2020-09-27 ENCOUNTER — Other Ambulatory Visit: Payer: Self-pay | Admitting: General Surgery

## 2020-11-23 ENCOUNTER — Encounter (INDEPENDENT_AMBULATORY_CARE_PROVIDER_SITE_OTHER): Payer: Self-pay

## 2020-11-29 ENCOUNTER — Telehealth: Payer: Self-pay

## 2020-11-29 NOTE — Telephone Encounter (Signed)
Called to make a new patient appointment for patient. Patient's voice mail is full.

## 2020-11-30 NOTE — Progress Notes (Signed)
New Patient Note  RE: Heather Lee MRN: [id number] DOB: [birth year] Date of Office Visit: 12/01/2020  Referring provider: Maurice Small, MD Primary care provider: Maurice Small, MD  Chief Complaint: Allergy Testing (Wants to see what she is allergic to. Patient says she is having nausea and headaches. Nov. 12 and lasted for 4 days. Last thursday it happened and it lasted for 5 days. Patient says it happens at random times.  For vertigo she does the summersault exercise and it helps at times. Last episode she hardly kept food down.)  History of Present Illness: I had the pleasure of seeing Heather Lee for initial evaluation at the [facility] on 12/01/2020. She is a 49 y.o. female, who is referred here by Maurice Small, MD for the evaluation of dizziness.  Patient has been having issues with severe dizziness, nausea and intense migraines for the past year but had 2 bad episodes in November.  Last episode lasted about 4 days and concerned about allergies contributing to her symptoms.  These episodes are random with no known triggers. Denies any other associated symptoms besides abdominal pains due to nausea.  She was diagnosed with vertigo and has been doing exercises at home. No prior physical therapy or ENT evaluation.   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   Assessment and Plan: Heather Lee is a 49 y.o. female with: Dizziness Dizziness, nausea, migraines for the past year but worsening with some episodes lasting few days at a time. Concerned about allergic triggers but has not been able to identify a specific cause. Doing at home exercises for vertigo. No prior ENT work up.   Discussed at length that food allergy testing is not indicated for the above symptoms. Will do environmental allergy testing today only.  Today's skin testing showed: Positive to grass, trees, mold, cat, weed pollen, cockroaches. Borderline to dust mites, ragweed  pollen.  The above allergens may be worsening her nasal symptoms and possibly worsening her dizziness and headaches however, I do not believe it is the sole cause of her symptoms.   Will refer to ENT for further evaluation.  Start environmental control measures as below.  May use Flonase (fluticasone) nasal spray 2 sprays per nostril once a day.  May use over the counter antihistamines such as Zyrtec (cetirizine), Claritin (loratadine), Allegra (fexofenadine), or Xyzal (levocetirizine) daily as needed.  Other allergic rhinitis Rhino conjunctivitis symptoms mainly in the spring/summer. Does not take any medications for this.  Today's skin testing showed: Positive to grass, trees, mold, cat, weed pollen, cockroaches. Borderline to dust mites, ragweed pollen.  Start environmental control measures as below.  May use Flonase (fluticasone) nasal spray 2 sprays per nostril once a day.  May use over the counter antihistamines such as Zyrtec (cetirizine), Claritin (loratadine), Allegra (fexofenadine), or Xyzal (levocetirizine) daily as needed.  Return in about 3 months (around 03/01/2021), or if symptoms worsen or fail to improve.  Meds ordered this encounter  Medications  . fluticasone (FLONASE) 50 MCG/ACT nasal spray    Sig: Place 2 sprays into both nostrils daily.    Dispense:  16 g    Refill:  5   Other allergy screening: Asthma: no Rhino conjunctivitis: yes  She reports symptoms of sneezing, itchy throat, watery eyes. Symptoms have been going on for many years. The symptoms are present mainly in the spring/summer. She does not take any medications for this. Previous work up includes: none. Previous ENT evaluation: no.  Food allergy: patient  is lactose intolerant Medication allergy: yes Hymenoptera allergy: not sure but had some type of reaction to yellow jacket.  Urticaria: yes Sometimes breaks out in the arms, chest randomly with no known triggers. Eczema:no History of  recurrent infections suggestive of immunodeficency: no  Diagnostics: Skin Testing: Environmental allergy panel. Positive to grass, trees, mold, cat, weed pollen, cockroaches. Borderline to dust mites, ragweed pollen. Results discussed with patient/family.  Airborne Adult Perc - 12/01/20 0932    Time Antigen Placed 0932    Allergen Manufacturer Waynette Buttery    Location Back    Number of Test 59    Panel 1 Select    1. Control-Buffer 50% Glycerol Negative    2. Control-Histamine 1 mg/ml 2+    3. Albumin saline Negative    4. Bahia 2+    5. [place] 2+    6. Johnson 2+    7. Kentucky Blue 2+    8. Meadow Fescue 2+    9. Perennial Rye 2+    10. Sweet Vernal Negative    11. Timothy 2+    12. Cocklebur Negative    13. Burweed Marshelder Negative    14. Ragweed, short Negative    15. Ragweed, Giant Negative    16. Plantain,  English Negative    17. Lamb's Quarters Negative    18. Sheep Sorrell Negative    19. Rough Pigweed Negative    20. Marsh Elder, Rough Negative    21. Mugwort, Common Negative    22. Ash mix Negative    23. Birch mix 4+    24. Beech American 2+    25. Box, Elder Negative    26. Cedar, red 3+    27. Cottonwood, Eastern 2+    28. Elm mix 2+    29. Hickory 2+    30. Maple mix Negative    31. Oak, Guinea[place]Bissau mix 3+    32. Pecan Pollen 3+    33. Pine mix Negative    34. Sycamore Eastern Negative    35. Walnut, Black Pollen Negative    36. Alternaria alternata 2+    37. Cladosporium Herbarum Negative    38. Aspergillus mix Negative    39. Penicillium mix Negative    40. Bipolaris sorokiniana (Helminthosporium) 2+    41. Drechslera spicifera (Curvularia) 2+    42. Mucor plumbeus Negative    43. Fusarium moniliforme 3+    44. Aureobasidium pullulans (pullulara) Negative    45. Rhizopus oryzae Negative    46. Botrytis cinera 2+    47. Epicoccum nigrum 2+    48. Phoma betae Negative    49. Candida Albicans Negative    50. Trichophyton mentagrophytes Negative     51. Mite, D Farinae  5,000 AU/ml --   +/-   52. Mite, D Pteronyssinus  5,000 AU/ml --   +/-   53. Cat Hair 10,000 BAU/ml 3+    54.  Dog Epithelia Negative    55. Mixed Feathers Negative    56. Horse Epithelia Negative    57. Cockroach, German Negative    58. Mouse Negative    59. Tobacco Leaf Negative          Intradermal - 12/01/20 1028    Time Antigen Placed 1030    Allergen Manufacturer Waynette Buttery    Location Arm    Number of Test 6    Control Negative    Ragweed mix --   +/-   Weed mix 2+    Mold  2 2+    Dog Negative    Cockroach 3+           Past Medical History: Patient Active Problem List   Diagnosis Date Noted  . Dizziness 12/01/2020  . Other allergic rhinitis 12/01/2020   Past Medical History:  Diagnosis Date  . Anemia   . Anxiety   . Depression   . Hypertension   . Leg pain   . Sebaceous cyst of breast, left   . Thyromegaly    Past Surgical History: Past Surgical History:  Procedure Laterality Date  . BACK SURGERY    . BREAST CYST EXCISION Left 07/02/2018   Procedure: LEFT BREAST SEBACEOUS CYST EXCISION;  Surgeon: Emelia LoronWakefield, Matthew, MD;  Location: [facility];  Service: General;  Laterality: Left;  . HYSTEROTOMY  08/31/2018   Medication List:  Current Outpatient Medications  Medication Sig Dispense Refill  . Multiple Vitamin (MULTIVITAMIN WITH MINERALS) TABS tablet Take 1 tablet by mouth daily.    Marland Kitchen. acetaminophen (TYLENOL) 500 MG tablet Take by mouth.    . desvenlafaxine (PRISTIQ) 50 MG 24 hr tablet Take 50 mg by mouth daily.    . ferrous sulfate 325 (65 FE) MG EC tablet Take 325 mg by mouth 3 (three) times daily with meals.    . Fluoxetine HCl, PMDD, 20 MG CAPS Take by mouth.    . fluticasone (FLONASE) 50 MCG/ACT nasal spray Place 2 sprays into both nostrils daily. 16 g 5  . hydrochlorothiazide (MICROZIDE) 12.5 MG capsule Take 12.5 mg by mouth daily.    Marland Kitchen. olmesartan (BENICAR) 20 MG tablet olmesartan 20 mg tablet  TAKE 1 TABLET BY MOUTH  EVERY DAY     No current facility-administered medications for this visit.   Allergies: Allergies  Allergen Reactions  . Ace Inhibitors Other (See Comments)    cough  . Penicillins Hives   Social History: Social History   Socioeconomic History  . Marital status: Legally Separated    Spouse name: Not on file  . Number of children: Not on file  . Years of education: Not on file  . Highest education level: Not on file  Occupational History  . Not on file  Tobacco Use  . Smoking status: Never Smoker  . Smokeless tobacco: Never Used  Substance and Sexual Activity  . Alcohol use: Yes    Comment: social  . Drug use: Never  . Sexual activity: Not Currently  Other Topics Concern  . Not on file  Social History Narrative  . Not on file   Social Determinants of Health   Financial Resource Strain:   . Difficulty of Paying Living Expenses: Not on file  Food Insecurity:   . Worried About Programme researcher, broadcasting/film/videounning Out of Food in the Last Year: Not on file  . Ran Out of Food in the Last Year: Not on file  Transportation Needs:   . Lack of Transportation (Medical): Not on file  . Lack of Transportation (Non-Medical): Not on file  Physical Activity:   . Days of Exercise per Week: Not on file  . Minutes of Exercise per Session: Not on file  Stress:   . Feeling of Stress : Not on file  Social Connections:   . Frequency of Communication with Friends and Family: Not on file  . Frequency of Social Gatherings with Friends and Family: Not on file  . Attends Religious Services: Not on file  . Active Member of Clubs or Organizations: Not on file  . Attends Club  or Organization Meetings: Not on file  . Marital Status: Not on file   Lives in a 51 year old home. Smoking: denies Occupation: Adult nurse HistorySurveyor, minerals in the house: no Carpet in the family room: yes Carpet in the bedroom: yes Heating: heat pump Cooling: heat pump Pet: no  Family History: History reviewed.  No pertinent family history. Problem                               Relation Asthma                                   No  Eczema                                Daughter  Food allergy                          No  Allergic rhino conjunctivitis     No   Review of Systems  Constitutional: Negative for appetite change, chills, fever and unexpected weight change.  HENT: Negative for congestion and rhinorrhea.   Eyes: Negative for itching.  Respiratory: Negative for cough, chest tightness, shortness of breath and wheezing.   Cardiovascular: Negative for chest pain.  Gastrointestinal: Positive for nausea. Negative for abdominal pain.  Genitourinary: Negative for difficulty urinating.  Skin: Negative for rash.  Allergic/Immunologic: Positive for environmental allergies.  Neurological: Positive for dizziness and headaches.   Objective: BP 122/82   Pulse 76   Temp (!) 97.4 F (36.3 C)   Resp 16   Ht 5\' 7"  (1.702 m)   Wt 231 lb 3.2 oz (104.9 kg)   SpO2 96%   BMI 36.21 kg/m  Body mass index is 36.21 kg/m. Physical Exam Vitals and nursing note reviewed.  Constitutional:      Appearance: Normal appearance. She is well-developed.  HENT:     Head: Normocephalic and atraumatic.     Right Ear: External ear normal.     Left Ear: External ear normal.     Nose: Nose normal.     Mouth/Throat:     Mouth: Mucous membranes are moist.     Pharynx: Oropharynx is clear.  Eyes:     Conjunctiva/sclera: Conjunctivae normal.  Cardiovascular:     Rate and Rhythm: Normal rate and regular rhythm.     Heart sounds: Normal heart sounds. No murmur heard.  No friction rub. No gallop.   Pulmonary:     Effort: Pulmonary effort is normal.     Breath sounds: Normal breath sounds. No wheezing, rhonchi or rales.  Abdominal:     Palpations: Abdomen is soft.  Musculoskeletal:     Cervical back: Neck supple.  Skin:    General: Skin is warm.     Findings: No rash.  Neurological:     Mental Status: She is  alert and oriented to person, place, and time.  Psychiatric:        Behavior: Behavior normal.    The plan was reviewed with the patient/family, and all questions/concerned were addressed.  It was my pleasure to see Heather Lee today and participate in her care. Please feel free to contact me with any questions or concerns.  Sincerely,  Adela Lank, DO Allergy & Immunology  [facility] office: [place] office: [phone]

## 2020-12-01 ENCOUNTER — Encounter: Payer: Self-pay | Admitting: Allergy

## 2020-12-01 ENCOUNTER — Telehealth: Payer: Self-pay

## 2020-12-01 ENCOUNTER — Other Ambulatory Visit: Payer: Self-pay

## 2020-12-01 ENCOUNTER — Encounter (INDEPENDENT_AMBULATORY_CARE_PROVIDER_SITE_OTHER): Payer: Self-pay

## 2020-12-01 ENCOUNTER — Ambulatory Visit: Payer: BC Managed Care – PPO | Admitting: Allergy

## 2020-12-01 VITALS — BP 122/82 | HR 76 | Temp 97.4°F | Resp 16 | Ht 67.0 in | Wt 231.2 lb

## 2020-12-01 DIAGNOSIS — J3089 Other allergic rhinitis: Secondary | ICD-10-CM | POA: Diagnosis not present

## 2020-12-01 DIAGNOSIS — R42 Dizziness and giddiness: Secondary | ICD-10-CM | POA: Insufficient documentation

## 2020-12-01 MED ORDER — FLUTICASONE PROPIONATE 50 MCG/ACT NA SUSP: 2.0000 | Freq: Every day | NASAL | 5 refills | Status: DC

## 2020-12-01 NOTE — Assessment & Plan Note (Signed)
Dizziness, nausea, migraines for the past year but worsening with some episodes lasting few days at a time. Concerned about allergic triggers but has not been able to identify a specific cause. Doing at home exercises for vertigo. No prior ENT work up.   Discussed at length that food allergy testing is not indicated for the above symptoms. Will do environmental allergy testing today only.  Today's skin testing showed: Positive to grass, trees, mold, cat, weed pollen, cockroaches. Borderline to dust mites, ragweed pollen.  The above allergens may be worsening her nasal symptoms and possibly worsening her dizziness and headaches however, I do not believe it is the sole cause of her symptoms.   Will refer to ENT for further evaluation.  Start environmental control measures as below.  May use Flonase (fluticasone) nasal spray 2 sprays per nostril once a day.  May use over the counter antihistamines such as Zyrtec (cetirizine), Claritin (loratadine), Allegra (fexofenadine), or Xyzal (levocetirizine) daily as needed.

## 2020-12-01 NOTE — Telephone Encounter (Signed)
Can you send a referral to Dr. Suszanne Conners. Referral needed for vertigo.

## 2020-12-01 NOTE — Patient Instructions (Addendum)
Today's skin testing showed: Positive to grass, trees, mold, cat, weed pollen, cockroaches. Borderline to dust mites, ragweed pollen.  Environmental allergies  Start environmental control measures as below.  May use Flonase (fluticasone) nasal spray 2 sprays per nostril once a day.  May use over the counter antihistamines such as Zyrtec (cetirizine), Claritin (loratadine), Allegra (fexofenadine), or Xyzal (levocetirizine) daily as needed.  Dizziness  Recommend ENT referral next - Dr. Suszanne Conners.  Food allergies do not cause your symptoms.  Follow up in 3 months or sooner if needed.   Reducing Pollen Exposure . Pollen seasons: trees (spring), grass (summer) and ragweed/weeds (fall). Marland Kitchen Keep windows closed in your home and car to lower pollen exposure.  Lilian Kapur air conditioning in the bedroom and throughout the house if possible.  . Avoid going out in dry windy days - especially early morning. . Pollen counts are highest between 5 - 10 AM and on dry, hot and windy days.  . Save outside activities for late afternoon or after a heavy rain, when pollen levels are lower.  . Avoid mowing of grass if you have grass pollen allergy. Marland Kitchen Be aware that pollen can also be transported indoors on people and pets.  . Dry your clothes in an automatic dryer rather than hanging them outside where they might collect pollen.  . Rinse hair and eyes before bedtime. Pet Allergen Avoidance: . Contrary to popular opinion, there are no "hypoallergenic" breeds of dogs or cats. That is because people are not allergic to an animal's hair, but to an allergen found in the animal's saliva, dander (dead skin flakes) or urine. Pet allergy symptoms typically occur within minutes. For some people, symptoms can build up and become most severe 8 to 12 hours after contact with the animal. People with severe allergies can experience reactions in public places if dander has been transported on the pet owners' clothing. Marland Kitchen Keeping an  animal outdoors is only a partial solution, since homes with pets in the yard still have higher concentrations of animal allergens. . Before getting a pet, ask your allergist to determine if you are allergic to animals. If your pet is already considered part of your family, try to minimize contact and keep the pet out of the bedroom and other rooms where you spend a great deal of time. . As with dust mites, vacuum carpets often or replace carpet with a hardwood floor, tile or linoleum. . High-efficiency particulate air (HEPA) cleaners can reduce allergen levels over time. . While dander and saliva are the source of cat and dog allergens, urine is the source of allergens from rabbits, hamsters, mice and [place] pigs; so ask a non-allergic family member to clean the animal's cage. . If you have a pet allergy, talk to your allergist about the potential for allergy immunotherapy (allergy shots). This strategy can often provide long-term relief. Mold Control . Mold and fungi can grow on a variety of surfaces provided certain temperature and moisture conditions exist.  . Outdoor molds grow on plants, decaying vegetation and soil. The major outdoor mold, Alternaria and Cladosporium, are found in very high numbers during hot and dry conditions. Generally, a late summer - fall peak is seen for common outdoor fungal spores. Rain will temporarily lower outdoor mold spore count, but counts rise rapidly when the rainy period ends. . The most important indoor molds are Aspergillus and Penicillium. Dark, humid and poorly ventilated basements are ideal sites for mold growth. The next most common sites of  mold growth are the bathroom and the kitchen. Outdoor (Seasonal) Mold Control . Use air conditioning and keep windows closed. . Avoid exposure to decaying vegetation. Marland Kitchen Avoid leaf raking. . Avoid grain handling. . Consider wearing a face mask if working in moldy areas.  Indoor (Perennial) Mold Control  . Maintain  humidity below 50%. . Get rid of mold growth on hard surfaces with water, detergent and, if necessary, 5% bleach (do not mix with other cleaners). Then dry the area completely. If mold covers an area more than 10 square feet, consider hiring an indoor environmental professional. . For clothing, washing with soap and water is best. If moldy items cannot be cleaned and dried, throw them away. . Remove sources e.g. contaminated carpets. . Repair and seal leaking roofs or pipes. Using dehumidifiers in damp basements may be helpful, but empty the water and clean units regularly to prevent mildew from forming. All rooms, especially basements, bathrooms and kitchens, require ventilation and cleaning to deter mold and mildew growth. Avoid carpeting on concrete or damp floors, and storing items in damp areas. Cockroach Allergen Avoidance Cockroaches are often found in the homes of densely populated urban areas, schools or commercial buildings, but these creatures can lurk almost anywhere. This does not mean that you have a dirty house or living area. . Block all areas where roaches can enter the home. This includes crevices, wall cracks and windows.  . Cockroaches need water to survive, so fix and seal all leaky faucets and pipes. Have an exterminator go through the house when your family and pets are gone to eliminate any remaining roaches. Marland Kitchen Keep food in lidded containers and put pet food dishes away after your pets are done eating. Vacuum and sweep the floor after meals, and take out garbage and recyclables. Use lidded garbage containers in the kitchen. Wash dishes immediately after use and clean under stoves, refrigerators or toasters where crumbs can accumulate. Wipe off the stove and other kitchen surfaces and cupboards regularly.  Benign Positional Vertigo Vertigo is the feeling that you or your surroundings are moving when they are not. Benign positional vertigo is the most common form of vertigo. This is  usually a harmless condition (benign). This condition is positional. This means that symptoms are triggered by certain movements and positions. This condition can be dangerous if it occurs while you are doing something that could cause harm to you or others. This includes activities such as driving or operating machinery. What are the causes? In many cases, the cause of this condition is not known. It may be caused by a disturbance in an area of the inner ear that helps your brain to sense movement and balance. This disturbance can be caused by:  Viral infection (labyrinthitis).  Head injury.  Repetitive motion, such as jumping, dancing, or running. What increases the risk? You are more likely to develop this condition if:  You are a woman.  You are 51 years of age or older. What are the signs or symptoms? Symptoms of this condition usually happen when you move your head or your eyes in different directions. Symptoms may start suddenly, and usually last for less than a minute. They include:  Loss of balance and falling.  Feeling like you are spinning or moving.  Feeling like your surroundings are spinning or moving.  Nausea and vomiting.  Blurred vision.  Dizziness.  Involuntary eye movement (nystagmus). Symptoms can be mild and cause only minor problems, or they can be severe  and interfere with daily life. Episodes of benign positional vertigo may return (recur) over time. Symptoms may improve over time. How is this diagnosed? This condition may be diagnosed based on:  Your medical history.  Physical exam of the head, neck, and ears.  Tests, such as: ? MRI. ? CT scan. ? Eye movement tests. Your health care provider may ask you to change positions quickly while he or she watches you for symptoms of benign positional vertigo, such as nystagmus. Eye movement may be tested with a variety of exams that are designed to evaluate or stimulate vertigo. ? An electroencephalogram  (EEG). This records electrical activity in your brain. ? Hearing tests. You may be referred to a health care provider who specializes in ear, nose, and throat (ENT) problems (otolaryngologist) or a provider who specializes in disorders of the nervous system (neurologist). How is this treated?  This condition may be treated in a session in which your health care provider moves your head in specific positions to adjust your inner ear back to normal. Treatment for this condition may take several sessions. Surgery may be needed in severe cases, but this is rare. In some cases, benign positional vertigo may resolve on its own in 2-4 weeks. Follow these instructions at home: Safety  Move slowly. Avoid sudden body or head movements or certain positions, as told by your health care provider.  Avoid driving until your health care provider says it is safe for you to do so.  Avoid operating heavy machinery until your health care provider says it is safe for you to do so.  Avoid doing any tasks that would be dangerous to you or others if vertigo occurs.  If you have trouble walking or keeping your balance, try using a cane for stability. If you feel dizzy or unstable, sit down right away.  Return to your normal activities as told by your health care provider. Ask your health care provider what activities are safe for you. General instructions  Take over-the-counter and prescription medicines only as told by your health care provider.  Drink enough fluid to keep your urine pale yellow.  Keep all follow-up visits as told by your health care provider. This is important. Contact a health care provider if:  You have a fever.  Your condition gets worse or you develop new symptoms.  Your family or friends notice any behavioral changes.  You have nausea or vomiting that gets worse.  You have numbness or a "pins and needles" sensation. Get help right away if you:  Have difficulty speaking or  moving.  Are always dizzy.  Faint.  Develop severe headaches.  Have weakness in your legs or arms.  Have changes in your hearing or vision.  Develop a stiff neck.  Develop sensitivity to light. Summary  Vertigo is the feeling that you or your surroundings are moving when they are not. Benign positional vertigo is the most common form of vertigo.  The cause of this condition is not known. It may be caused by a disturbance in an area of the inner ear that helps your brain to sense movement and balance.  Symptoms include loss of balance and falling, feeling that you or your surroundings are moving, nausea and vomiting, and blurred vision.  This condition can be diagnosed based on symptoms, physical exam, and other tests, such as MRI, CT scan, eye movement tests, and hearing tests.  Follow safety instructions as told by your health care provider. You will also be  told when to contact your health care provider in case of problems. This information is not intended to replace advice given to you by your health care provider. Make sure you discuss any questions you have with your health care provider. Document Revised: 05/22/2018 Document Reviewed: 05/22/2018 Elsevier Patient Education  2020 [employer].

## 2020-12-01 NOTE — Assessment & Plan Note (Signed)
Rhino conjunctivitis symptoms mainly in the spring/summer. Does not take any medications for this.  Today's skin testing showed: Positive to grass, trees, mold, cat, weed pollen, cockroaches. Borderline to dust mites, ragweed pollen.  Start environmental control measures as below.  May use Flonase (fluticasone) nasal spray 2 sprays per nostril once a day.  May use over the counter antihistamines such as Zyrtec (cetirizine), Claritin (loratadine), Allegra (fexofenadine), or Xyzal (levocetirizine) daily as needed.

## 2020-12-06 NOTE — Telephone Encounter (Signed)
Patients phone went to voicemail and voicemail was full. I have sent the patient a mychart message.   Thanks

## 2020-12-28 ENCOUNTER — Encounter (INDEPENDENT_AMBULATORY_CARE_PROVIDER_SITE_OTHER): Payer: Self-pay | Admitting: Family Medicine

## 2020-12-28 ENCOUNTER — Ambulatory Visit (INDEPENDENT_AMBULATORY_CARE_PROVIDER_SITE_OTHER): Payer: BC Managed Care – PPO | Admitting: Family Medicine

## 2020-12-28 ENCOUNTER — Other Ambulatory Visit: Payer: Self-pay

## 2020-12-28 VITALS — BP 131/86 | HR 82 | Temp 97.9°F | Ht 67.0 in | Wt 232.0 lb

## 2020-12-28 DIAGNOSIS — R0602 Shortness of breath: Secondary | ICD-10-CM | POA: Diagnosis not present

## 2020-12-28 DIAGNOSIS — R5383 Other fatigue: Secondary | ICD-10-CM

## 2020-12-28 DIAGNOSIS — Z9189 Other specified personal risk factors, not elsewhere classified: Secondary | ICD-10-CM | POA: Diagnosis not present

## 2020-12-28 DIAGNOSIS — I10 Essential (primary) hypertension: Secondary | ICD-10-CM | POA: Diagnosis not present

## 2020-12-28 DIAGNOSIS — Z1331 Encounter for screening for depression: Secondary | ICD-10-CM

## 2020-12-28 DIAGNOSIS — Z6836 Body mass index (BMI) 36.0-36.9, adult: Secondary | ICD-10-CM

## 2020-12-28 DIAGNOSIS — Z0289 Encounter for other administrative examinations: Secondary | ICD-10-CM

## 2020-12-29 LAB — CBC WITH DIFFERENTIAL/PLATELET
Basophils Absolute: 0 10*3/uL (ref 0.0–0.2)
Basos: 0 %
EOS (ABSOLUTE): 0.2 10*3/uL (ref 0.0–0.4)
Eos: 3 %
Hematocrit: 43.2 % (ref 34.0–46.6)
Hemoglobin: 14.2 g/dL (ref 11.1–15.9)
Immature Grans (Abs): 0 10*3/uL (ref 0.0–0.1)
Immature Granulocytes: 0 %
Lymphocytes Absolute: 2.2 10*3/uL (ref 0.7–3.1)
Lymphs: 43 %
MCH: 26.8 pg (ref 26.6–33.0)
MCHC: 32.9 g/dL (ref 31.5–35.7)
MCV: 82 fL (ref 79–97)
Monocytes Absolute: 0.5 10*3/uL (ref 0.1–0.9)
Monocytes: 9 %
Neutrophils Absolute: 2.3 10*3/uL (ref 1.4–7.0)
Neutrophils: 45 %
Platelets: 375 10*3/uL (ref 150–450)
RBC: 5.3 x10E6/uL — ABNORMAL HIGH (ref 3.77–5.28)
RDW: 12.7 % (ref 11.7–15.4)
WBC: 5.2 10*3/uL (ref 3.4–10.8)

## 2020-12-29 LAB — COMPREHENSIVE METABOLIC PANEL
ALT: 18 IU/L (ref 0–32)
AST: 21 IU/L (ref 0–40)
Albumin/Globulin Ratio: 1.5 (ref 1.2–2.2)
Albumin: 4.1 g/dL (ref 3.8–4.9)
Alkaline Phosphatase: 71 IU/L (ref 44–121)
BUN/Creatinine Ratio: 9 (ref 9–23)
BUN: 8 mg/dL (ref 6–24)
Bilirubin Total: 0.6 mg/dL (ref 0.0–1.2)
CO2: 28 mmol/L (ref 20–29)
Calcium: 9.2 mg/dL (ref 8.7–10.2)
Chloride: 103 mmol/L (ref 96–106)
Creatinine, Ser: 0.87 mg/dL (ref 0.57–1.00)
GFR calc Af Amer: 89 mL/min/{1.73_m2} (ref >59)
GFR calc non Af Amer: 77 mL/min/{1.73_m2} (ref >59)
Globulin, Total: 2.7 g/dL (ref 1.5–4.5)
Glucose: 82 mg/dL (ref 65–99)
Potassium: 4.4 mmol/L (ref 3.5–5.2)
Sodium: 142 mmol/L (ref 134–144)
Total Protein: 6.8 g/dL (ref 6.0–8.5)

## 2020-12-29 LAB — LIPID PANEL WITH LDL/HDL RATIO
Cholesterol, Total: 190 mg/dL (ref 100–199)
HDL: 69 mg/dL (ref >39)
LDL Chol Calc (NIH): 110 mg/dL — ABNORMAL HIGH (ref 0–99)
LDL/HDL Ratio: 1.6 ratio (ref 0.0–3.2)
Triglycerides: 59 mg/dL (ref 0–149)
VLDL Cholesterol Cal: 11 mg/dL (ref 5–40)

## 2020-12-29 LAB — T4: T4, Total: 7.8 ug/dL (ref 4.5–12.0)

## 2020-12-29 LAB — HEMOGLOBIN A1C
Est. average glucose Bld gHb Est-mCnc: 117 mg/dL
Hgb A1c MFr Bld: 5.7 % — ABNORMAL HIGH (ref 4.8–5.6)

## 2020-12-29 LAB — VITAMIN B12: Vitamin B-12: 759 pg/mL (ref 232–1245)

## 2020-12-29 LAB — VITAMIN D 25 HYDROXY (VIT D DEFICIENCY, FRACTURES): Vit D, 25-Hydroxy: 30.5 ng/mL (ref 30.0–100.0)

## 2020-12-29 LAB — INSULIN, RANDOM: INSULIN: 30.4 u[IU]/mL — ABNORMAL HIGH (ref 2.6–24.9)

## 2020-12-29 LAB — TSH: TSH: 1.77 u[IU]/mL (ref 0.450–4.500)

## 2020-12-29 LAB — FOLATE: Folate: 19.4 ng/mL (ref >3.0)

## 2020-12-29 LAB — T3: T3, Total: 114 ng/dL (ref 71–180)

## 2021-01-03 NOTE — Progress Notes (Signed)
Chief Complaint:   OBESITY Heather Lee (MR# [id number]) is a 51 y.o. female who presents for evaluation and treatment of obesity and related comorbidities. Current BMI is Body mass index is 36.34 kg/m. Heather Lee has been struggling with her weight for many years and has been unsuccessful in either losing weight, maintaining weight loss, or reaching her healthy weight goal.  Heather Lee is a pleasant 50 year old female, who is ready to concentrate on weight loss and becoming healthier.  Heather Lee is currently in the action stage of change and ready to dedicate time achieving and maintaining a healthier weight. Heather Lee is interested in becoming our patient and working on intensive lifestyle modifications including (but not limited to) diet and exercise for weight loss.  Heather Lee's habits were reviewed today and are as follows: Her family eats meals together, she thinks her family will eat healthier with her, her desired weight loss is 82 lbs, she has been heavy most of her life, she started gaining weight in 1995 when she was married, her heaviest weight ever was 231 pounds, she has significant food cravings issues, she snacks frequently in the evenings, she is frequently drinking liquids with calories, she frequently eats larger portions than normal and she struggles with emotional eating.  Depression Screen Heather Lee's Food and Mood (modified PHQ-9) score was 18.  Depression screen PHQ 2/9 12/28/2020  Decreased Interest 3  Down, Depressed, Hopeless 2  PHQ - 2 Score 5  Altered sleeping 2  Tired, decreased energy 2  Change in appetite 2  Feeling bad or failure about yourself  3  Trouble concentrating 3  Moving slowly or fidgety/restless 1  Suicidal thoughts 0  PHQ-9 Score 18   Subjective:   1. Other fatigue Heather Lee admits to daytime somnolence and admits to waking up still tired. Patent has a history of symptoms of daytime fatigue. Heather Lee generally gets  6 hours of sleep per night, and states that she has nightime awakenings. Snoring is present. Apneic episodes are present. Epworth Sleepiness Score is 15.  2. Shortness of breath on exertion Heather Lee notes increasing shortness of breath with exercising and seems to be worsening over time with weight gain. She notes getting out of breath sooner with activity than she used to. This has not gotten worse recently. Heather Lee denies shortness of breath at rest or orthopnea.  3. Primary hypertension Heather Lee's blood pressure is stable today on her medications. She would like to work on weight loss, and control her blood pressure without medications eventually. She denies chest pain.  4. At risk for heart disease Heather Lee is at a higher than average risk for cardiovascular disease due to obesity.   Assessment/Plan:   1. Other fatigue Heather Lee does feel that her weight is causing her energy to be lower than it should be. Fatigue may be related to obesity, depression or many other causes. Labs will be ordered, and in the meanwhile, Heather Lee will focus on self care including making healthy food choices, increasing physical activity and focusing on stress reduction.  - Vitamin B12 - CBC with Differential/Platelet - Comprehensive metabolic panel - Folate - Hemoglobin A1c - T3 - Lipid Panel With LDL/HDL Ratio - Insulin, random - T4 - TSH - VITAMIN D 25 Hydroxy (Vit-D Deficiency, Fractures)  2. Shortness of breath on exertion Heather Lee does feel that she gets out of breath more easily that she used to when she exercises. Heather Lee's shortness of breath appears to be obesity related and exercise induced. She  has agreed to work on weight loss and gradually increase exercise to treat her exercise induced shortness of breath. Will continue to monitor closely.  - EKG 12-Lead  3. Primary hypertension Heather Lee will start her Category 2 eating plan, and will continue working on healthy  weight loss and exercise to improve blood pressure control. We will watch for signs of hypotension as she continues her lifestyle modifications. We will check labs today.  4. At risk for heart disease Heather Lee was given approximately 30 minutes of coronary artery disease prevention counseling today. She is 50 y.o. female and has risk factors for heart disease including obesity. We discussed intensive lifestyle modifications today with an emphasis on specific weight loss instructions and strategies.   Repetitive spaced learning was employed today to elicit superior memory formation and behavioral change.  5. Depression screening Heather Lee had a positive depression screening. Depression is commonly associated with obesity and often results in emotional eating behaviors. We will monitor this closely and work on CBT to help improve the non-hunger eating patterns. Referral to Psychology may be required if no improvement is seen as she continues in our clinic.  6. Class 2 severe obesity with serious comorbidity and body mass index (BMI) of 36.0 to 36.9 in adult, unspecified obesity type [facility]) Heather Lee is currently in the action stage of change and her goal is to continue with weight loss efforts. I recommend Heather Lee begin the structured treatment plan as follows:  She has agreed to the Category 2 Plan.  Exercise goals: No exercise has been prescribed for now, while we concentrate on nutritional changes.  Behavioral modification strategies: meal planning and cooking strategies and planning for success.  She was informed of the importance of frequent follow-up visits to maximize her success with intensive lifestyle modifications for her multiple health conditions. She was informed we would discuss her lab results at her next visit unless there is a critical issue that needs to be addressed sooner. Heather Lee agreed to keep her next visit at the agreed upon time to discuss these  results.  Objective:   Blood pressure 131/86, pulse 82, temperature 97.9 F (36.6 C), height 5\' 7"  (1.702 m), weight 232 lb (105.2 kg), SpO2 96 %. Body mass index is 36.34 kg/m.  EKG: Normal sinus rhythm, rate 83 BPM.  Indirect Calorimeter completed today shows a VO2 of 231 and a REE of 1605.  Her calculated basal metabolic rate is thus her basal metabolic rate is worse than expected.  General: Cooperative, alert, well developed, in no acute distress. HEENT: Conjunctivae and lids unremarkable. Cardiovascular: Regular rhythm.  Lungs: Normal work of breathing. Neurologic: No focal deficits.   Lab Results  Component Value Date   CREATININE 0.87 12/28/2020   BUN 8 12/28/2020   NA 142 12/28/2020   K 4.4 12/28/2020   CL 103 12/28/2020   CO2 28 12/28/2020   Lab Results  Component Value Date   ALT 18 12/28/2020   AST 21 12/28/2020   ALKPHOS 71 12/28/2020   BILITOT 0.6 12/28/2020   Lab Results  Component Value Date   HGBA1C 5.7 (H) 12/28/2020   Lab Results  Component Value Date   INSULIN 30.4 (H) 12/28/2020   Lab Results  Component Value Date   TSH 1.770 12/28/2020   Lab Results  Component Value Date   CHOL 190 12/28/2020   HDL 69 12/28/2020   LDLCALC 110 (H) 12/28/2020   TRIG 59 12/28/2020   Lab Results  Component Value Date  WBC 5.2 12/28/2020   HGB 14.2 12/28/2020   HCT 43.2 12/28/2020   MCV 82 12/28/2020   PLT 375 12/28/2020   No results found for: IRON, TIBC, FERRITIN  Attestation Statements:   Reviewed by clinician on day of visit: allergies, medications, problem list, medical history, surgical history, family history, social history, and previous encounter notes.   I, Burt Knack, am acting as transcriptionist for Quillian Quince, MD.  I have reviewed the above documentation for accuracy and completeness, and I agree with the above. - Quillian Quince, MD

## 2021-01-06 ENCOUNTER — Encounter (INDEPENDENT_AMBULATORY_CARE_PROVIDER_SITE_OTHER): Payer: Self-pay

## 2021-01-10 ENCOUNTER — Ambulatory Visit (INDEPENDENT_AMBULATORY_CARE_PROVIDER_SITE_OTHER): Payer: BC Managed Care – PPO | Admitting: Family Medicine

## 2021-01-11 ENCOUNTER — Telehealth (INDEPENDENT_AMBULATORY_CARE_PROVIDER_SITE_OTHER): Payer: BC Managed Care – PPO | Admitting: Family Medicine

## 2021-01-11 ENCOUNTER — Encounter (INDEPENDENT_AMBULATORY_CARE_PROVIDER_SITE_OTHER): Payer: Self-pay

## 2021-01-11 ENCOUNTER — Encounter (INDEPENDENT_AMBULATORY_CARE_PROVIDER_SITE_OTHER): Payer: Self-pay | Admitting: Family Medicine

## 2021-01-11 ENCOUNTER — Other Ambulatory Visit: Payer: Self-pay

## 2021-01-11 DIAGNOSIS — E559 Vitamin D deficiency, unspecified: Secondary | ICD-10-CM

## 2021-01-11 DIAGNOSIS — E7800 Pure hypercholesterolemia, unspecified: Secondary | ICD-10-CM

## 2021-01-11 DIAGNOSIS — R7303 Prediabetes: Secondary | ICD-10-CM | POA: Diagnosis not present

## 2021-01-11 DIAGNOSIS — Z6836 Body mass index (BMI) 36.0-36.9, adult: Secondary | ICD-10-CM

## 2021-01-11 DIAGNOSIS — I10 Essential (primary) hypertension: Secondary | ICD-10-CM | POA: Diagnosis not present

## 2021-01-11 MED ORDER — METFORMIN HCL 500 MG PO TABS
500.0000 mg | ORAL_TABLET | Freq: Every day | ORAL | 0 refills | Status: DC
Start: 2021-01-11 — End: 2021-02-10

## 2021-01-11 MED ORDER — VITAMIN D (ERGOCALCIFEROL) 1.25 MG (50000 UNIT) PO CAPS: 50000.0000 [IU] | ORAL_CAPSULE | ORAL | 0 refills | Status: DC

## 2021-01-12 NOTE — Progress Notes (Addendum)
TeleHealth Visit:  Due to the COVID-19 pandemic, this visit was completed with telemedicine (audio/video) technology to reduce patient and provider exposure as well as to preserve personal protective equipment.   Heather Lee has verbally consented to this TeleHealth visit. The patient is located at home, the provider is located at the [facility] office. The participants in this visit include the listed provider and patient. The visit was conducted today via MyChart video.   Chief Complaint: OBESITY Heather Lee is here to discuss her progress with her obesity treatment plan along with follow-up of her obesity related diagnoses. Heather Lee is on the Category 2 Plan and states she is following her eating plan approximately 100% of the time. Heather Lee states she is doing 0 minutes 0 times per week.  Today's visit was #: 2 Starting weight: 232 lbs Starting date: 12/28/2020  Interim History: Heather Lee has done well following her Category 2 plan. Her hunger was mostly controlled, but not always. She feels she lost approximately 4 lbs in the last 2 weeks, which is exactly what I hoped she would lose.   Subjective:   1. Vitamin D deficiency Heather Lee has a new diagnosis of Vit D deficiency. She is on multivitamins and Ca+, but her Vit D level is still below goal. She notes fatigue. I discussed labs with the patient today.  2. Pre-diabetes Heather Lee has a new diagnosis of pre-diabetes. Her A1c is elevated at 5.7, fasting insulin is elevated at 30, and fasting glucose is within normal limits. I discussed labs with the patient today.  3. Elevated LDL cholesterol level Heather Lee has a new diagnosis of elevated LDL. She is not on statin, and her LDL is mildly elevated but her HDL and triglycerides are within normal limits. I discussed labs with the patient today.                           4. Essential hypertension Heather Lee is on Benicar. Her blood pressure has been well  controlled on her last 2 visit at [facility].  Assessment/Plan:   1. Vitamin D deficiency Low Vitamin D level contributes to fatigue and are associated with obesity, breast, and colon cancer. Heather Lee agreed to start prescription Vitamin D 50,000 IU every week with no refills, and we will recheck labs in 3 months. She will follow-up for routine testing of Vitamin D, at least 2-3 times per year to avoid over-replacement.  - Vitamin D, Ergocalciferol, (DRISDOL) 1.25 MG (50000 UNIT) CAPS capsule; Take 1 capsule (50,000 Units total) by mouth every 7 (seven) days.  Dispense: 4 capsule; Refill: 0  2. Pre-diabetes Heather Lee will continue to work on weight loss, exercise, and decreasing simple carbohydrates to help decrease the risk of diabetes. Heather Lee agreed to start metformin 500 mg q AM with no refills. We will recheck labs in 3 months.  - metFORMIN (GLUCOPHAGE) 500 MG tablet; Take 1 tablet (500 mg total) by mouth daily with breakfast.  Dispense: 30 tablet; Refill: 0  3. Elevated LDL cholesterol level Cardiovascular risk and specific lipid/LDL goals reviewed. We discussed several lifestyle modifications today. Heather Lee will continue to work on diet, exercise and weight loss efforts. We will recheck labs in 3 months. Orders and follow up as documented in patient record.   Counseling Intensive lifestyle modifications are the first line treatment for this issue.  Dietary changes: Increase soluble fiber. Decrease simple carbohydrates.  Exercise changes: Moderate to vigorous-intensity aerobic activity 150 minutes per week if  tolerated.  Lipid-lowering medications: see documented in medical record.  4. Essential hypertension Heather Lee will continue her medications and diet, and may be able to decrease her medications with further weight loss. We will continue to monitor closely, and will watch for signs of hypotension as she continues her lifestyle modifications.  5. Class 2 severe  obesity with serious comorbidity and body mass index (BMI) of 36.0 to 36.9 in adult, unspecified obesity type Heather Lee) Heather Lee is currently in the action stage of change. As such, her goal is to continue with weight loss efforts. She has agreed to the Category 2 Plan.   Behavioral modification strategies: no skipping meals and meal planning and cooking strategies.  Heather Lee has agreed to follow-up with our clinic in 2 weeks. She was informed of the importance of frequent follow-up visits to maximize her success with intensive lifestyle modifications for her multiple health conditions.  Objective:   VITALS: Per patient if applicable, see vitals. GENERAL: Alert and in no acute distress. CARDIOPULMONARY: No increased WOB. Speaking in clear sentences.  PSYCH: Pleasant and cooperative. Speech normal rate and rhythm. Affect is appropriate. Insight and judgement are appropriate. Attention is focused, linear, and appropriate.  NEURO: Oriented as arrived to appointment on time with no prompting.   Lab Results  Component Value Date   CREATININE 0.87 12/28/2020   BUN 8 12/28/2020   NA 142 12/28/2020   K 4.4 12/28/2020   CL 103 12/28/2020   CO2 28 12/28/2020   Lab Results  Component Value Date   ALT 18 12/28/2020   AST 21 12/28/2020   ALKPHOS 71 12/28/2020   BILITOT 0.6 12/28/2020   Lab Results  Component Value Date   HGBA1C 5.7 (H) 12/28/2020   Lab Results  Component Value Date   INSULIN 30.4 (H) 12/28/2020   Lab Results  Component Value Date   TSH 1.770 12/28/2020   Lab Results  Component Value Date   CHOL 190 12/28/2020   HDL 69 12/28/2020   LDLCALC 110 (H) 12/28/2020   TRIG 59 12/28/2020   Lab Results  Component Value Date   WBC 5.2 12/28/2020   HGB 14.2 12/28/2020   HCT 43.2 12/28/2020   MCV 82 12/28/2020   PLT 375 12/28/2020   No results found for: IRON, TIBC, FERRITIN  Attestation Statements:   Reviewed by clinician on day of visit: allergies, medications,  problem list, medical history, surgical history, family history, social history, and previous encounter notes.  Time spent on visit including pre-visit chart review and post-visit charting and care was 45 minutes.    I, Burt Knack, am acting as transcriptionist for Quillian Quince, MD.  I have reviewed the above documentation for accuracy and completeness, and I agree with the above. - Quillian Quince, MD

## 2021-01-18 ENCOUNTER — Encounter (INDEPENDENT_AMBULATORY_CARE_PROVIDER_SITE_OTHER): Payer: Self-pay | Admitting: Family Medicine

## 2021-01-18 NOTE — Telephone Encounter (Signed)
Anette Riedel, can you see in the note what she is mentioning? She has only been seen the 1 time, correct?

## 2021-01-26 ENCOUNTER — Ambulatory Visit (INDEPENDENT_AMBULATORY_CARE_PROVIDER_SITE_OTHER): Payer: BC Managed Care – PPO | Admitting: Adult Health

## 2021-01-26 ENCOUNTER — Other Ambulatory Visit (HOSPITAL_COMMUNITY): Payer: Self-pay | Admitting: Internal Medicine

## 2021-01-26 ENCOUNTER — Other Ambulatory Visit: Payer: Self-pay

## 2021-01-26 ENCOUNTER — Ambulatory Visit: Payer: Self-pay | Attending: Internal Medicine

## 2021-01-26 ENCOUNTER — Encounter (INDEPENDENT_AMBULATORY_CARE_PROVIDER_SITE_OTHER): Payer: Self-pay | Admitting: Adult Health

## 2021-01-26 VITALS — BP 130/78 | HR 76 | Temp 98.0°F | Ht 67.0 in | Wt 221.0 lb

## 2021-01-26 DIAGNOSIS — E669 Obesity, unspecified: Secondary | ICD-10-CM

## 2021-01-26 DIAGNOSIS — Z6834 Body mass index (BMI) 34.0-34.9, adult: Secondary | ICD-10-CM

## 2021-01-26 DIAGNOSIS — R7303 Prediabetes: Secondary | ICD-10-CM | POA: Diagnosis not present

## 2021-01-26 DIAGNOSIS — K5909 Other constipation: Secondary | ICD-10-CM | POA: Insufficient documentation

## 2021-01-26 DIAGNOSIS — Z23 Encounter for immunization: Secondary | ICD-10-CM

## 2021-01-26 NOTE — Progress Notes (Signed)
   Covid-19 Vaccination Clinic  Name:  Heather Lee    MRN: [id number] DOB: [birth year]  01/26/2021  [name] was observed post Covid-19 immunization for 15 minutes without incident. She was provided with Vaccine Information Sheet and instruction to access the V-Safe system.   [name] was instructed to call 911 with any severe reactions post vaccine: Marland Kitchen Difficulty breathing  . Swelling of face and throat  . A fast heartbeat  . A bad rash all over body  . Dizziness and weakness   Immunizations Administered    Name Date Dose VIS Date Route   Pfizer COVID-19 Vaccine 01/26/2021  9:31 AM 0.3 mL 10/13/2020 Intramuscular   Manufacturer: [employer], [employer]   Lot: [id number]   NDC: [address]-0

## 2021-01-26 NOTE — Progress Notes (Signed)
Chief Complaint:   OBESITY Heather Lee is here to discuss her progress with her obesity treatment plan along with follow-up of her obesity related diagnoses. Heather Lee is on the Category 2 Plan and states she is following her eating plan approximately 95% of the time. Heather Lee states she is doing 0 minutes 0 times per week.  Today's visit was #: 3 Starting weight: 232 lbs Starting date: 12/28/2020 Today's weight: 221 lbs Today's date: 01/26/2021 Total lbs lost to date: 11 lbs Total lbs lost since last in-office visit: 11 lbs  Interim History: Pt has diligently packed sandwich, side salad, and fruit everyday when working. She has added an egg in for breakfast (total 3 eggs) to substitute for 1 of the 2 slices of bread. Pt's 3 month goal is to reduce her A1c. Last level 5.7 on 12/28/2020.  Subjective:   1. Other constipation Previously to starting Category 2, pt reports daily bowel movements were 1-4 times a day. Now she is experiencing slow motility with hardened stools. She denies abdominal pain or hematochezia. She denies family history of colon cancer. Her scopes are up to date.   2. Pre-diabetes 12/28/2020 BG 82, A1c 5.7, Insulin level 30.4. She was started on Metformin 500 mg with breakfast. Pt denies diarrhea. She is concerned with family history of type 2 diabetes and is determined to lower A1c.   Lab Results  Component Value Date   HGBA1C 5.7 (H) 12/28/2020   Lab Results  Component Value Date   INSULIN 30.4 (H) 12/28/2020    Assessment/Plan:   1. Other constipation Heather Lee was informed that a decrease in bowel movement frequency is normal while losing weight, but stools should not be hard or painful. Orders and follow up as documented in patient record. Remain well hydrated. Increase fiber intake. OTC Miralax as directed by manufacturer. OTC stool softener.  Counseling Getting to Good Bowel Health: Your goal is to have one soft bowel movement each day. Drink at least  8 glasses of water each day. Eat plenty of fiber (goal is over 25 grams each day). It is best to get most of your fiber from dietary sources which includes leafy green vegetables, fresh fruit, and whole grains. You may need to add fiber with the help of OTC fiber supplements. These include Metamucil, Citrucel, and Flaxseed. If you are still having trouble, try adding Miralax or Magnesium Citrate. If all of these changes do not work, Dietitian.  2. Pre-diabetes Heather Lee will continue to work on weight loss, exercise, and decreasing simple carbohydrates to help decrease the risk of diabetes. Continue Category 2 meal plan and Metformin 500 mg daily.  3. Class 1 obesity with serious comorbidity and body mass index (BMI) of 34.0 to 34.9 in adult, unspecified obesity type Heather Lee is currently in the action stage of change. As such, her goal is to continue with weight loss efforts. She has agreed to the Category 2 Plan.   Exercise goals: No exercise has been prescribed at this time.  Behavioral modification strategies: increasing lean protein intake, decreasing simple carbohydrates, meal planning and cooking strategies and planning for success.  Heather Lee has agreed to follow-up with our clinic in 2 weeks. She was informed of the importance of frequent follow-up visits to maximize her success with intensive lifestyle modifications for her multiple health conditions.   Objective:   Blood pressure 130/78, pulse 76, temperature 98 F (36.7 C), height 5\' 7"  (1.702 m), weight 221 lb (100.2 kg), SpO2 96 %.  Body mass index is 34.61 kg/m.  General: Cooperative, alert, well developed, in no acute distress. HEENT: Conjunctivae and lids unremarkable. Cardiovascular: Regular rhythm.  Lungs: Normal work of breathing. Neurologic: No focal deficits.   Lab Results  Component Value Date   CREATININE 0.87 12/28/2020   BUN 8 12/28/2020   NA 142 12/28/2020   K 4.4 12/28/2020   CL 103  12/28/2020   CO2 28 12/28/2020   Lab Results  Component Value Date   ALT 18 12/28/2020   AST 21 12/28/2020   ALKPHOS 71 12/28/2020   BILITOT 0.6 12/28/2020   Lab Results  Component Value Date   HGBA1C 5.7 (H) 12/28/2020   Lab Results  Component Value Date   INSULIN 30.4 (H) 12/28/2020   Lab Results  Component Value Date   TSH 1.770 12/28/2020   Lab Results  Component Value Date   CHOL 190 12/28/2020   HDL 69 12/28/2020   LDLCALC 110 (H) 12/28/2020   TRIG 59 12/28/2020   Lab Results  Component Value Date   WBC 5.2 12/28/2020   HGB 14.2 12/28/2020   HCT 43.2 12/28/2020   MCV 82 12/28/2020   PLT 375 12/28/2020   No results found for: IRON, TIBC, FERRITIN  Attestation Statements:   Reviewed by clinician on day of visit: allergies, medications, problem list, medical history, surgical history, family history, social history, and previous encounter notes.  Time spent on visit including pre-visit chart review and post-visit care and charting was 34 minutes.   Edmund Hilda, am acting as Energy manager for William Hamburger, NP.  I have reviewed the above documentation for accuracy and completeness, and I agree with the above. -  Ammanda Dobbins d. Clea Dubach, NP-C

## 2021-01-27 DIAGNOSIS — E669 Obesity, unspecified: Secondary | ICD-10-CM | POA: Insufficient documentation

## 2021-02-04 ENCOUNTER — Other Ambulatory Visit (INDEPENDENT_AMBULATORY_CARE_PROVIDER_SITE_OTHER): Payer: Self-pay | Admitting: Family Medicine

## 2021-02-04 DIAGNOSIS — R7303 Prediabetes: Secondary | ICD-10-CM

## 2021-02-07 ENCOUNTER — Encounter (INDEPENDENT_AMBULATORY_CARE_PROVIDER_SITE_OTHER): Payer: Self-pay

## 2021-02-07 DIAGNOSIS — I10 Essential (primary) hypertension: Secondary | ICD-10-CM | POA: Insufficient documentation

## 2021-02-07 DIAGNOSIS — E049 Nontoxic goiter, unspecified: Secondary | ICD-10-CM | POA: Insufficient documentation

## 2021-02-07 DIAGNOSIS — D649 Anemia, unspecified: Secondary | ICD-10-CM | POA: Insufficient documentation

## 2021-02-07 NOTE — Telephone Encounter (Signed)
Last OV with Katy 

## 2021-02-10 ENCOUNTER — Encounter (INDEPENDENT_AMBULATORY_CARE_PROVIDER_SITE_OTHER): Payer: Self-pay | Admitting: Adult Health

## 2021-02-10 ENCOUNTER — Other Ambulatory Visit: Payer: Self-pay

## 2021-02-10 ENCOUNTER — Ambulatory Visit (INDEPENDENT_AMBULATORY_CARE_PROVIDER_SITE_OTHER): Payer: BC Managed Care – PPO | Admitting: Physician Assistant

## 2021-02-10 VITALS — BP 114/76 | HR 78 | Temp 98.4°F | Ht 67.0 in | Wt 219.0 lb

## 2021-02-10 DIAGNOSIS — Z9189 Other specified personal risk factors, not elsewhere classified: Secondary | ICD-10-CM | POA: Diagnosis not present

## 2021-02-10 DIAGNOSIS — E559 Vitamin D deficiency, unspecified: Secondary | ICD-10-CM

## 2021-02-10 DIAGNOSIS — R7303 Prediabetes: Secondary | ICD-10-CM

## 2021-02-10 DIAGNOSIS — E669 Obesity, unspecified: Secondary | ICD-10-CM | POA: Diagnosis not present

## 2021-02-10 DIAGNOSIS — Z6834 Body mass index (BMI) 34.0-34.9, adult: Secondary | ICD-10-CM | POA: Diagnosis not present

## 2021-02-10 MED ORDER — METFORMIN HCL 500 MG PO TABS: 500.0000 mg | ORAL_TABLET | Freq: Every day | ORAL | 0 refills | Status: DC

## 2021-02-10 MED ORDER — VITAMIN D (ERGOCALCIFEROL) 1.25 MG (50000 UNIT) PO CAPS: 50000.0000 [IU] | ORAL_CAPSULE | ORAL | 0 refills | Status: DC

## 2021-02-14 NOTE — Progress Notes (Signed)
Chief Complaint:   OBESITY Heather Lee is here to discuss her progress with her obesity treatment plan along with follow-up of her obesity related diagnoses. Heather Lee is on the Category 2 Plan and states she is following her eating plan approximately 95% of the time. Heather Lee states she is bike riding for 30 minutes 3 times per week.  Today's visit was #: 4 Starting weight: 232 lbs Starting date: 12/28/2020 Today's weight: 219 lbs Today's date: 02/10/2021 Total lbs lost to date: 13 Total lbs lost since last in-office visit: 2  Interim History: Heather Lee is doing very well with weight loss. She would like to eat oatmeal with breakfast. She is eating a sandwich with veggies every day for lunch. She is having trouble eating her protein at dinner. She just celebrated a birthday and did not have any cake.  Subjective:   1. Pre-diabetes Heather Lee is taking metformin daily, and she is tolerating it well.  2. Vitamin D deficiency Heather Lee is on Vit D weekly, and she is tolerating it well.  3. At risk for diabetes mellitus Heather Lee is at higher than average risk for developing diabetes due to obesity.   Assessment/Plan:   1. Pre-diabetes Heather Lee will continue to work on weight loss, exercise, and decreasing simple carbohydrates to help decrease the risk of diabetes. We will refill metformin for 1 month.  - metFORMIN (GLUCOPHAGE) 500 MG tablet; Take 1 tablet (500 mg total) by mouth daily with breakfast.  Dispense: 30 tablet; Refill: 0  2. Vitamin D deficiency Low Vitamin D level contributes to fatigue and are associated with obesity, breast, and colon cancer. We will refill prescription Vitamin D for 1 month. Heather Lee will follow-up for routine testing of Vitamin D, at least 2-3 times per year to avoid over-replacement.  - Vitamin D, Ergocalciferol, (DRISDOL) 1.25 MG (50000 UNIT) CAPS capsule; Take 1 capsule (50,000 Units total) by mouth every 7 (seven) days.  Dispense: 4  capsule; Refill: 0  3. At risk for diabetes mellitus Heather Lee was given approximately 15 minutes of diabetes education and counseling today. We discussed intensive lifestyle modifications today with an emphasis on weight loss as well as increasing exercise and decreasing simple carbohydrates in her diet. We also reviewed medication options with an emphasis on risk versus benefit of those discussed.   Repetitive spaced learning was employed today to elicit superior memory formation and behavioral change.  4. Class 1 obesity with serious comorbidity and body mass index (BMI) of 34.0 to 34.9 in adult, unspecified obesity type Heather Lee is currently in the action stage of change. As such, her goal is to continue with weight loss efforts. She has agreed to the Category 2 Plan.   Protein equivalent was given today.  Exercise goals: As is.  Behavioral modification strategies: increasing lean protein intake and meal planning and cooking strategies.  Heather Lee has agreed to follow-up with our clinic in 2 weeks. She was informed of the importance of frequent follow-up visits to maximize her success with intensive lifestyle modifications for her multiple health conditions.   Objective:   Blood pressure 114/76, pulse 78, temperature 98.4 F (36.9 C), height 5\' 7"  (1.702 m), weight 219 lb (99.3 kg), SpO2 98 %. Body mass index is 34.3 kg/m.  General: Cooperative, alert, well developed, in no acute distress. HEENT: Conjunctivae and lids unremarkable. Cardiovascular: Regular rhythm.  Lungs: Normal work of breathing. Neurologic: No focal deficits.   Lab Results  Component Value Date   CREATININE 0.87 12/28/2020   BUN 8  12/28/2020   NA 142 12/28/2020   K 4.4 12/28/2020   CL 103 12/28/2020   CO2 28 12/28/2020   Lab Results  Component Value Date   ALT 18 12/28/2020   AST 21 12/28/2020   ALKPHOS 71 12/28/2020   BILITOT 0.6 12/28/2020   Lab Results  Component Value Date   HGBA1C 5.7  (H) 12/28/2020   Lab Results  Component Value Date   INSULIN 30.4 (H) 12/28/2020   Lab Results  Component Value Date   TSH 1.770 12/28/2020   Lab Results  Component Value Date   CHOL 190 12/28/2020   HDL 69 12/28/2020   LDLCALC 110 (H) 12/28/2020   TRIG 59 12/28/2020   Lab Results  Component Value Date   WBC 5.2 12/28/2020   HGB 14.2 12/28/2020   HCT 43.2 12/28/2020   MCV 82 12/28/2020   PLT 375 12/28/2020   No results found for: IRON, TIBC, FERRITIN  Attestation Statements:   Reviewed by clinician on day of visit: allergies, medications, problem list, medical history, surgical history, family history, social history, and previous encounter notes.   Trude Mcburney, am acting as transcriptionist for [employer], PA-C.  I have reviewed the above documentation for accuracy and completeness, and I agree with the above. Alois Cliche, PA-C

## 2021-02-18 ENCOUNTER — Encounter (INDEPENDENT_AMBULATORY_CARE_PROVIDER_SITE_OTHER): Payer: Self-pay | Admitting: Family Medicine

## 2021-02-21 ENCOUNTER — Ambulatory Visit (INDEPENDENT_AMBULATORY_CARE_PROVIDER_SITE_OTHER): Payer: BC Managed Care – PPO | Admitting: Family Medicine

## 2021-02-23 ENCOUNTER — Encounter (INDEPENDENT_AMBULATORY_CARE_PROVIDER_SITE_OTHER): Payer: Self-pay | Admitting: Family Medicine

## 2021-02-24 ENCOUNTER — Telehealth (INDEPENDENT_AMBULATORY_CARE_PROVIDER_SITE_OTHER): Payer: BC Managed Care – PPO | Admitting: Family Medicine

## 2021-03-03 ENCOUNTER — Encounter (INDEPENDENT_AMBULATORY_CARE_PROVIDER_SITE_OTHER): Payer: Self-pay | Admitting: Physician Assistant

## 2021-03-03 NOTE — Telephone Encounter (Signed)
Please review and advise.

## 2021-03-04 ENCOUNTER — Ambulatory Visit: Payer: BC Managed Care – PPO | Admitting: Allergy

## 2021-03-05 ENCOUNTER — Other Ambulatory Visit (INDEPENDENT_AMBULATORY_CARE_PROVIDER_SITE_OTHER): Payer: Self-pay | Admitting: Physician Assistant

## 2021-03-05 DIAGNOSIS — R7303 Prediabetes: Secondary | ICD-10-CM

## 2021-03-07 NOTE — Telephone Encounter (Signed)
Last OV with Tracey 

## 2021-03-09 ENCOUNTER — Other Ambulatory Visit: Payer: Self-pay

## 2021-03-09 ENCOUNTER — Encounter (INDEPENDENT_AMBULATORY_CARE_PROVIDER_SITE_OTHER): Payer: Self-pay | Admitting: Family Medicine

## 2021-03-09 ENCOUNTER — Ambulatory Visit (INDEPENDENT_AMBULATORY_CARE_PROVIDER_SITE_OTHER): Payer: BC Managed Care – PPO | Admitting: Family Medicine

## 2021-03-09 VITALS — BP 118/82 | HR 77 | Temp 98.1°F | Ht 67.0 in | Wt 224.0 lb

## 2021-03-09 DIAGNOSIS — R7303 Prediabetes: Secondary | ICD-10-CM

## 2021-03-09 DIAGNOSIS — Z6835 Body mass index (BMI) 35.0-35.9, adult: Secondary | ICD-10-CM

## 2021-03-09 DIAGNOSIS — Z9189 Other specified personal risk factors, not elsewhere classified: Secondary | ICD-10-CM | POA: Diagnosis not present

## 2021-03-09 DIAGNOSIS — E559 Vitamin D deficiency, unspecified: Secondary | ICD-10-CM

## 2021-03-09 MED ORDER — VITAMIN D (ERGOCALCIFEROL) 1.25 MG (50000 UNIT) PO CAPS: 50000.0000 [IU] | ORAL_CAPSULE | ORAL | 0 refills | Status: DC

## 2021-03-10 ENCOUNTER — Ambulatory Visit (INDEPENDENT_AMBULATORY_CARE_PROVIDER_SITE_OTHER): Payer: BC Managed Care – PPO | Admitting: Adult Health

## 2021-03-10 NOTE — Progress Notes (Signed)
Chief Complaint:   OBESITY Heather Lee is here to discuss her progress with her obesity treatment plan along with follow-up of her obesity related diagnoses. Heather Lee is on the Category 2 Plan and states she is following her eating plan approximately 50% of the time. Heather Lee states she is doing 0 minutes 0 times per week.  Today's visit was #: 5 Starting weight: 232 lbs Starting date: 12/28/2020 Today's weight: 224 lbs Today's date: 03/09/2021 Total lbs lost to date: 8 lbs Total lbs lost since last in-office visit: 0  Interim History: Pt went to [place] for a week and did relatively well on plan. A few days after coming back, she started having significant GI distress and was dealing with GI symptoms for ~2 weeks. She finally started feeling better ~4 days ago. Pt voices that she's had a hard time getting salmon/meat protein down.  Subjective:   1. Vitamin D deficiency Pt's last Vit D level was 30.5. She denies nausea, vomiting, and muscle weakness but notes fatigue. Pt is on prescription Vit D.  2. Pre-diabetes Pt's last A1c 5.7 and insulin level 30.4. She was previously on Metformin but stopped when pt got stomach sick.  Lab Results  Component Value Date   HGBA1C 5.7 (H) 12/28/2020   Lab Results  Component Value Date   INSULIN 30.4 (H) 12/28/2020    3. At risk for osteoporosis Heather Lee is at higher risk of osteopenia and osteoporosis due to Vitamin D deficiency.   Assessment/Plan:   1. Vitamin D deficiency Low Vitamin D level contributes to fatigue and are associated with obesity, breast, and colon cancer. She agrees to continue to take prescription Vitamin D @50 ,000 IU every week and will follow-up for routine testing of Vitamin D, at least 2-3 times per year to avoid over-replacement.  - Vitamin D, Ergocalciferol, (DRISDOL) 1.25 MG (50000 UNIT) CAPS capsule; Take 1 capsule (50,000 Units total) by mouth every 7 (seven) days.  Dispense: 4 capsule; Refill:  0  2. Pre-diabetes Heather Lee will continue to work on weight loss, exercise, and decreasing simple carbohydrates to help decrease the risk of diabetes. Pt is hold off on Metformin until feeling better. Follow up at next appointment.  3. At risk for osteoporosis Heather Lee was given approximately 15 minutes of osteoporosis prevention counseling today. Heather Lee is at risk for osteopenia and osteoporosis due to her Vitamin D deficiency. She was encouraged to take her Vitamin D and follow her higher calcium diet and increase strengthening exercise to help strengthen her bones and decrease her risk of osteopenia and osteoporosis.  Repetitive spaced learning was employed today to elicit superior memory formation and behavioral change.  4. Class 2 severe obesity with serious comorbidity and body mass index (BMI) of 35.0 to 35.9 in adult, unspecified obesity type (HCC) Heather Lee is currently in the action stage of change. As such, her goal is to continue with weight loss efforts. She has agreed to the Category 2 Plan and the Pescatarian Plan.   Exercise goals: No exercise has been prescribed at this time.  Behavioral modification strategies: increasing lean protein intake, meal planning and cooking strategies, keeping healthy foods in the home and planning for success.  Heather Lee has agreed to follow-up with our clinic in 2 weeks. She was informed of the importance of frequent follow-up visits to maximize her success with intensive lifestyle modifications for her multiple health conditions.   Objective:   Blood pressure 118/82, pulse 77, temperature 98.1 F (36.7 C), temperature source Oral,  height 5\' 7"  (1.702 m), weight 224 lb (101.6 kg), SpO2 98 %. Body mass index is 35.08 kg/m.  General: Cooperative, alert, well developed, in no acute distress. HEENT: Conjunctivae and lids unremarkable. Cardiovascular: Regular rhythm.  Lungs: Normal work of breathing. Neurologic: No focal deficits.    Lab Results  Component Value Date   CREATININE 0.87 12/28/2020   BUN 8 12/28/2020   NA 142 12/28/2020   K 4.4 12/28/2020   CL 103 12/28/2020   CO2 28 12/28/2020   Lab Results  Component Value Date   ALT 18 12/28/2020   AST 21 12/28/2020   ALKPHOS 71 12/28/2020   BILITOT 0.6 12/28/2020   Lab Results  Component Value Date   HGBA1C 5.7 (H) 12/28/2020   Lab Results  Component Value Date   INSULIN 30.4 (H) 12/28/2020   Lab Results  Component Value Date   TSH 1.770 12/28/2020   Lab Results  Component Value Date   CHOL 190 12/28/2020   HDL 69 12/28/2020   LDLCALC 110 (H) 12/28/2020   TRIG 59 12/28/2020   Lab Results  Component Value Date   WBC 5.2 12/28/2020   HGB 14.2 12/28/2020   HCT 43.2 12/28/2020   MCV 82 12/28/2020   PLT 375 12/28/2020    Attestation Statements:   Reviewed by clinician on day of visit: allergies, medications, problem list, medical history, surgical history, family history, social history, and previous encounter notes.  02/25/2021, am acting as transcriptionist for Edmund Hilda, MD.  I have reviewed the above documentation for accuracy and completeness, and I agree with the above. - Reuben Likes, MD

## 2021-03-20 ENCOUNTER — Encounter (INDEPENDENT_AMBULATORY_CARE_PROVIDER_SITE_OTHER): Payer: Self-pay | Admitting: Family Medicine

## 2021-03-20 NOTE — Telephone Encounter (Signed)
Please advise about scheduling patient with Dr. Dewaine Conger.  Once confirmed I will call her to see if we can do a My chart visit on 3/30.  Thank you

## 2021-03-23 ENCOUNTER — Ambulatory Visit (INDEPENDENT_AMBULATORY_CARE_PROVIDER_SITE_OTHER): Payer: BC Managed Care – PPO | Admitting: Family Medicine

## 2021-03-23 ENCOUNTER — Other Ambulatory Visit: Payer: Self-pay

## 2021-03-23 ENCOUNTER — Encounter (INDEPENDENT_AMBULATORY_CARE_PROVIDER_SITE_OTHER): Payer: Self-pay | Admitting: Family Medicine

## 2021-03-23 VITALS — BP 118/75 | HR 78 | Temp 98.1°F | Ht 67.0 in | Wt 225.0 lb

## 2021-03-23 DIAGNOSIS — Z9189 Other specified personal risk factors, not elsewhere classified: Secondary | ICD-10-CM | POA: Diagnosis not present

## 2021-03-23 DIAGNOSIS — R4589 Other symptoms and signs involving emotional state: Secondary | ICD-10-CM | POA: Diagnosis not present

## 2021-03-23 DIAGNOSIS — E559 Vitamin D deficiency, unspecified: Secondary | ICD-10-CM

## 2021-03-23 DIAGNOSIS — R7303 Prediabetes: Secondary | ICD-10-CM | POA: Diagnosis not present

## 2021-03-23 DIAGNOSIS — Z6836 Body mass index (BMI) 36.0-36.9, adult: Secondary | ICD-10-CM

## 2021-03-23 MED ORDER — VITAMIN D (ERGOCALCIFEROL) 1.25 MG (50000 UNIT) PO CAPS: 50000.0000 [IU] | ORAL_CAPSULE | ORAL | 0 refills | Status: DC

## 2021-03-23 NOTE — Telephone Encounter (Signed)
Please advise 

## 2021-03-24 NOTE — Telephone Encounter (Signed)
She is asking about the Dr Dewaine Conger, not sure if you wrote for her to see her yet.

## 2021-03-31 NOTE — Progress Notes (Signed)
Chief Complaint:   OBESITY Heather Lee is here to discuss her progress with her obesity treatment plan along with follow-up of her obesity related diagnoses.   Today's visit was #: 6 Starting weight: 232 lbs Starting date: 12/28/2020 Today's weight: 225 lbs Today's date: 03/23/2021 Total lbs lost to date: 7 lbs Body mass index is 35.24 kg/m.  Total weight loss percentage to date: -3.02%  Interim History:  Heather Lee says she has been struggling over the past 2 weeks with getting back on plan; eating more simple carbs lately that is causing her to crave even more.  Recently, to help get her back on track, she threw out chips and other simple carbs.  She wants to get back on meal plan.  Plan:  Journal about her "why".  Refer to Dr. Dewaine Conger to help her with emotional eating.  Current Meal Plan: the Category 2 Plan and the Pescatarian Plan for 10% of the time.  Current Exercise Plan: None.  Assessment/Plan:   Medications Discontinued During This Encounter  Medication Reason  . metFORMIN (GLUCOPHAGE) 500 MG tablet   . Vitamin D, Ergocalciferol, (DRISDOL) 1.25 MG (50000 UNIT) CAPS capsule Reorder     Meds ordered this encounter  Medications  . DISCONTD: Vitamin D, Ergocalciferol, (DRISDOL) 1.25 MG (50000 UNIT) CAPS capsule    Sig: Take 1 capsule (50,000 Units total) by mouth every 7 (seven) days.    Dispense:  4 capsule    Refill:  0     1. Prediabetes Improving, but not optimized. Goal is HgbA1c < 5.7.  Medication: None.    Plan:  Discussed labs with patient today.  Restart metformin once following meal plan.  Counseling done on condition and importance of decreasing simple carbs and increasing protein (follow plan).  She will continue to focus on protein-rich, low simple carbohydrate foods. We reviewed the importance of hydration, regular exercise for stress reduction, and restorative sleep.   Lab Results  Component Value Date   HGBA1C 5.7 (H) 12/28/2020   Lab Results   Component Value Date   INSULIN 30.4 (H) 12/28/2020   2. Vitamin D deficiency Not at goal. Current vitamin D is 30.5, tested on 12/28/2020. Optimal goal > 50 ng/dL.  She is taking vitamin D 50,000 IU weekly.  Plan:  Continue to take prescription Vitamin D @50 ,000 IU every week as prescribed.  Follow-up for routine testing of Vitamin D, at least 2-3 times per year to avoid over-replacement.  - Refill Vitamin D, Ergocalciferol, (DRISDOL) 1.25 MG (50000 UNIT) CAPS capsule; Take 1 capsule (50,000 Units total) by mouth every 7 (seven) days.  Dispense: 4 capsule; Refill: 0  3. Depressed mood, with emotional eating Not at goal. Medication: Pristiq 50 mg daily.  Plan:  Behavior modification techniques were discussed today to help deal with emotional/non-hunger eating behaviors. Patient was referred to Dr. , our Bariatric Psychologist, for evaluation due to her elevated PHQ-9 score and significant struggles with emotional eating.  4. At risk for diabetes mellitus - Heather Lee was given diabetes prevention education and counseling today of more than 10 minutes.  - Counseled patient on pathophysiology of disease and meaning/ implication of lab results.  - Reviewed how certain foods can either stimulate or inhibit insulin release, and subsequently affect hunger pathways  - Importance of following a healthy meal plan with limiting amounts of simple carbohydrates discussed with patient - Effects of regular aerobic exercise on blood sugar regulation reviewed and encouraged an eventual goal of 30 min 5d/week  or more as a minimum.  - Briefly discussed treatment options, which always include dietary and lifestyle modification as first line.   - Handouts provided at patient's desire and/or told to go online to the American Diabetes Association website for further information.  5. Obesity, current BMI 35.2  Course: Heather Lee is currently in the action stage of change. As such, her goal is to continue  with weight loss efforts.   Nutrition goals: She has agreed to the Category 2 Plan and the Pescatarian Plan. She says she really likes the plan and it is easy to follow.  Exercise goals: All adults should avoid inactivity. Some physical activity is better than none, and adults who participate in any amount of physical activity gain some health benefits.  Behavioral modification strategies: increasing lean protein intake, decreasing simple carbohydrates, keeping healthy foods in the home, avoiding temptations and planning for success.  Heather Lee has agreed to follow-up with our clinic in 2 weeks. She was informed of the importance of frequent follow-up visits to maximize her success with intensive lifestyle modifications for her multiple health conditions.   Objective:   Blood pressure 118/75, pulse 78, temperature 98.1 F (36.7 C), height 5\' 7"  (1.702 m), weight 225 lb (102.1 kg), SpO2 98 %. Body mass index is 35.24 kg/m.  General: Cooperative, alert, well developed, in no acute distress. HEENT: Conjunctivae and lids unremarkable. Cardiovascular: Regular rhythm.  Lungs: Normal work of breathing. Neurologic: No focal deficits.   Lab Results  Component Value Date   CREATININE 0.87 12/28/2020   BUN 8 12/28/2020   NA 142 12/28/2020   K 4.4 12/28/2020   CL 103 12/28/2020   CO2 28 12/28/2020   Lab Results  Component Value Date   ALT 18 12/28/2020   AST 21 12/28/2020   ALKPHOS 71 12/28/2020   BILITOT 0.6 12/28/2020   Lab Results  Component Value Date   HGBA1C 5.7 (H) 12/28/2020   Lab Results  Component Value Date   INSULIN 30.4 (H) 12/28/2020   Lab Results  Component Value Date   TSH 1.770 12/28/2020   Lab Results  Component Value Date   CHOL 190 12/28/2020   HDL 69 12/28/2020   LDLCALC 110 (H) 12/28/2020   TRIG 59 12/28/2020   Lab Results  Component Value Date   WBC 5.2 12/28/2020   HGB 14.2 12/28/2020   HCT 43.2 12/28/2020   MCV 82 12/28/2020   PLT 375  12/28/2020   Attestation Statements:   Reviewed by clinician on day of visit: allergies, medications, problem list, medical history, surgical history, family history, social history, and previous encounter notes.  I, 02/25/2021, CMA, am acting as Insurance claims handler for Energy manager, DO.  I have reviewed the above documentation for accuracy and completeness, and I agree with the above. [employer], D.O.  The 21st Century Cures Act was signed into law in 2016 which includes the topic of electronic health records.  This provides immediate access to information in MyChart.  This includes consultation notes, operative notes, office notes, lab results and pathology reports.  If you have any questions about what you read please let 2017 know at your next visit so we can discuss your concerns and take corrective action if need be.  We are right here with you.

## 2021-04-04 ENCOUNTER — Other Ambulatory Visit: Payer: Self-pay

## 2021-04-04 ENCOUNTER — Encounter (INDEPENDENT_AMBULATORY_CARE_PROVIDER_SITE_OTHER): Payer: Self-pay | Admitting: Family Medicine

## 2021-04-04 ENCOUNTER — Ambulatory Visit (INDEPENDENT_AMBULATORY_CARE_PROVIDER_SITE_OTHER): Payer: BC Managed Care – PPO | Admitting: Family Medicine

## 2021-04-04 VITALS — BP 132/81 | HR 73 | Temp 97.9°F | Ht 67.0 in | Wt 223.0 lb

## 2021-04-04 DIAGNOSIS — K5792 Diverticulitis of intestine, part unspecified, without perforation or abscess without bleeding: Secondary | ICD-10-CM

## 2021-04-04 DIAGNOSIS — Z9189 Other specified personal risk factors, not elsewhere classified: Secondary | ICD-10-CM | POA: Diagnosis not present

## 2021-04-04 DIAGNOSIS — E559 Vitamin D deficiency, unspecified: Secondary | ICD-10-CM

## 2021-04-04 DIAGNOSIS — Z6836 Body mass index (BMI) 36.0-36.9, adult: Secondary | ICD-10-CM | POA: Diagnosis not present

## 2021-04-04 MED ORDER — VITAMIN D (ERGOCALCIFEROL) 1.25 MG (50000 UNIT) PO CAPS: 50000.0000 [IU] | ORAL_CAPSULE | ORAL | 0 refills | Status: DC

## 2021-04-13 NOTE — Progress Notes (Signed)
Chief Complaint:   OBESITY Heather Lee is here to discuss her progress with her obesity treatment plan along with follow-up of her obesity related diagnoses. Heather Lee is on the [employer] and states she is following her eating plan approximately 0% of the time. Heather Lee states she is doing 0 minutes 0 times per week.  Today's visit was #: 7 Starting weight: 232 lbs Starting date: 12/28/2020 Today's weight: 223 lbs Today's date: 04/04/2021 Total lbs lost to date: 9 Total lbs lost since last in-office visit: 2  Interim History: Heather Lee hasn't been able to follow her plan recently with increased abdominal pain. She hasn't been able to eat all of her protein, but she has been doing her best. She is getting bored with her plan.  Subjective:   1. Diverticulitis Heather Lee was recently diagnosed with diverticulitis and she was concerned about starting antibiotics. She is uncertain what this diagnosis means.  2. Vitamin D deficiency Heather Lee is stable on Vit D, and she denies nausea, vomiting, or muscle weakness. Her Vit D level is not yet at goal.  3. At risk for side effect of medication Heather Lee is at risk for drug side effects due to antibiotics. She is to take probiotics to prevent this.  Assessment/Plan:   1. Diverticulitis Heather Lee was educated on diverticulitis and the need for antibiotics, as well as high risk of perforation. She is to follow up with her primary care physician as instructed.   2. Vitamin D deficiency Low Vitamin D level contributes to fatigue and are associated with obesity, breast, and colon cancer. We will refill prescription Vitamin D for 1 month. Heather Lee will follow-up for routine testing of Vitamin D, at least 2-3 times per year to avoid over-replacement.  - Vitamin D, Ergocalciferol, (DRISDOL) 1.25 MG (50000 UNIT) CAPS capsule; Take 1 capsule (50,000 Units total) by mouth every 7 (seven) days.  Dispense: 4 capsule; Refill: 0  3. At  risk for side effect of medication Heather Lee was given approximately 15 minutes of drug side effect counseling today.  We discussed side effect possibility and risk versus benefits. Heather Lee agreed to the medication and will contact this office if these side effects are intolerable.  Repetitive spaced learning was employed today to elicit superior memory formation and behavioral change.  4. Obesity with current BMI of 35.0 Heather Lee is currently in the action stage of change. As such, her goal is to continue with weight loss efforts. She has agreed to keeping a food journal and adhering to recommended goals of 1100-1400 calories and 80+ grams of protein daily.   Behavioral modification strategies: increasing lean protein intake and meal planning and cooking strategies.  Heather Lee has agreed to follow-up with our clinic in 3 weeks. She was informed of the importance of frequent follow-up visits to maximize her success with intensive lifestyle modifications for her multiple health conditions.   Objective:   Blood pressure 132/81, pulse 73, temperature 97.9 F (36.6 C), height 5\' 7"  (1.702 m), weight 223 lb (101.2 kg), SpO2 99 %. Body mass index is 34.93 kg/m.  General: Cooperative, alert, well developed, in no acute distress. HEENT: Conjunctivae and lids unremarkable. Cardiovascular: Regular rhythm.  Lungs: Normal work of breathing. Neurologic: No focal deficits.   Lab Results  Component Value Date   CREATININE 0.87 12/28/2020   BUN 8 12/28/2020   NA 142 12/28/2020   K 4.4 12/28/2020   CL 103 12/28/2020   CO2 28 12/28/2020   Lab Results  Component Value Date  ALT 18 12/28/2020   AST 21 12/28/2020   ALKPHOS 71 12/28/2020   BILITOT 0.6 12/28/2020   Lab Results  Component Value Date   HGBA1C 5.7 (H) 12/28/2020   Lab Results  Component Value Date   INSULIN 30.4 (H) 12/28/2020   Lab Results  Component Value Date   TSH 1.770 12/28/2020   Lab Results  Component  Value Date   CHOL 190 12/28/2020   HDL 69 12/28/2020   LDLCALC 110 (H) 12/28/2020   TRIG 59 12/28/2020   Lab Results  Component Value Date   WBC 5.2 12/28/2020   HGB 14.2 12/28/2020   HCT 43.2 12/28/2020   MCV 82 12/28/2020   PLT 375 12/28/2020   No results found for: IRON, TIBC, FERRITIN  Attestation Statements:   Reviewed by clinician on day of visit: allergies, medications, problem list, medical history, surgical history, family history, social history, and previous encounter notes.   I, Burt Knack, am acting as transcriptionist for Quillian Quince, MD.  I have reviewed the above documentation for accuracy and completeness, and I agree with the above. -  Quillian Quince, MD

## 2021-04-18 ENCOUNTER — Ambulatory Visit (INDEPENDENT_AMBULATORY_CARE_PROVIDER_SITE_OTHER): Payer: BC Managed Care – PPO | Admitting: Physician Assistant

## 2021-04-25 ENCOUNTER — Ambulatory Visit (INDEPENDENT_AMBULATORY_CARE_PROVIDER_SITE_OTHER): Payer: BC Managed Care – PPO | Admitting: Family Medicine

## 2021-05-02 ENCOUNTER — Other Ambulatory Visit (INDEPENDENT_AMBULATORY_CARE_PROVIDER_SITE_OTHER): Payer: Self-pay | Admitting: Physician Assistant

## 2021-05-02 DIAGNOSIS — R7303 Prediabetes: Secondary | ICD-10-CM

## 2021-05-02 NOTE — Telephone Encounter (Signed)
Dr.Beasley 

## 2021-05-04 ENCOUNTER — Other Ambulatory Visit: Payer: Self-pay | Admitting: General Surgery

## 2021-05-16 ENCOUNTER — Encounter (INDEPENDENT_AMBULATORY_CARE_PROVIDER_SITE_OTHER): Payer: Self-pay | Admitting: Family Medicine

## 2021-05-16 ENCOUNTER — Other Ambulatory Visit: Payer: Self-pay

## 2021-05-16 ENCOUNTER — Ambulatory Visit (INDEPENDENT_AMBULATORY_CARE_PROVIDER_SITE_OTHER): Payer: BC Managed Care – PPO | Admitting: Family Medicine

## 2021-05-16 VITALS — BP 120/80 | HR 79 | Temp 98.4°F | Ht 67.0 in | Wt 227.0 lb

## 2021-05-16 DIAGNOSIS — Z9189 Other specified personal risk factors, not elsewhere classified: Secondary | ICD-10-CM | POA: Diagnosis not present

## 2021-05-16 DIAGNOSIS — Z6836 Body mass index (BMI) 36.0-36.9, adult: Secondary | ICD-10-CM | POA: Diagnosis not present

## 2021-05-16 DIAGNOSIS — R7303 Prediabetes: Secondary | ICD-10-CM

## 2021-05-16 DIAGNOSIS — E559 Vitamin D deficiency, unspecified: Secondary | ICD-10-CM

## 2021-05-16 MED ORDER — VITAMIN D (ERGOCALCIFEROL) 1.25 MG (50000 UNIT) PO CAPS: 50000.0000 [IU] | ORAL_CAPSULE | ORAL | 0 refills | Status: DC

## 2021-05-16 MED ORDER — METFORMIN HCL 500 MG PO TABS: 500.0000 mg | ORAL_TABLET | Freq: Every day | ORAL | 0 refills | Status: DC

## 2021-05-17 LAB — VITAMIN D 25 HYDROXY (VIT D DEFICIENCY, FRACTURES): Vit D, 25-Hydroxy: 47 ng/mL (ref 30.0–100.0)

## 2021-05-18 NOTE — Progress Notes (Signed)
Chief Complaint:   OBESITY Naavya is here to discuss her progress with her obesity treatment plan along with follow-up of her obesity related diagnoses.   Today's visit was #: 8 Starting weight: 232 lbs Starting date: 12/28/2020 Today's weight: 227 lbs Today's date: 05/16/2021 Weight change since last visit: +4 lbs Total lbs lost to date: 5 lbs Body mass index is 35.55 kg/m.  Total weight loss percentage to date: -2.16%  Interim History:  Jaidon saw Dr. Dalbert Garnet on 04/04/2021 at her last office visit.  She has had a difficult time with staying on plan lately.  She has not been working the plan or eating on plan.  Plan:  Stay on journaling plan as it helps her be accountable.  Declines need for change.  Current Meal Plan: keeping a food journal and adhering to recommended goals of 1100-1400 calories and 80 grams of protein for 0% of the time.  Current Exercise Plan: None.  Assessment/Plan:   Orders Placed This Encounter  Procedures  . VITAMIN D 25 Hydroxy (Vit-D Deficiency, Fractures)   Medications Discontinued During This Encounter  Medication Reason  . Vitamin D, Ergocalciferol, (DRISDOL) 1.25 MG (50000 UNIT) CAPS capsule Reorder    Meds ordered this encounter  Medications  . Vitamin D, Ergocalciferol, (DRISDOL) 1.25 MG (50000 UNIT) CAPS capsule    Sig: Take 1 capsule (50,000 Units total) by mouth every 7 (seven) days.    Dispense:  4 capsule    Refill:  0    Ov for RF  . metFORMIN (GLUCOPHAGE) 500 MG tablet    Sig: Take 1 tablet (500 mg total) by mouth daily with breakfast.    Dispense:  30 tablet    Refill:  0    Ov for rf    1. Pre-diabetes At goal. Goal is HgbA1c < 5.7.  Medication: metformin 500 mg daily.  Off metformin for a couple of months now.  Wants to get back on it because it helped with appetite and sweets cravings.  Ants to get back on plan.  Plan:  Refill metformin at low dose, prudent nutritional plan, and increase protein.  Decrease simple  carbs.  Discussed GLP-1s as well, which we can consider in the future.  She will continue to focus on protein-rich, low simple carbohydrate foods. We reviewed the importance of hydration, regular exercise for stress reduction, and restorative sleep.    Lab Results  Component Value Date   HGBA1C 5.7 (H) 12/28/2020   Lab Results  Component Value Date   INSULIN 30.4 (H) 12/28/2020   - Refill metFORMIN (GLUCOPHAGE) 500 MG tablet; Take 1 tablet (500 mg total) by mouth daily with breakfast.  Dispense: 30 tablet; Refill: 0  2. Vitamin D deficiency Not at goal. Current vitamin D is 30.5, tested on 12/28/2020. Optimal goal > 50 ng/dL.  She is taking vitamin D 50,000 IU weekly.  Plan: Continue to take prescription Vitamin D @50 ,000 IU every week as prescribed.  Will check vitamin D level today.  - Refill Vitamin D, Ergocalciferol, (DRISDOL) 1.25 MG (50000 UNIT) CAPS capsule; Take 1 capsule (50,000 Units total) by mouth every 7 (seven) days.  Dispense: 4 capsule; Refill: 0 - VITAMIN D 25 Hydroxy (Vit-D Deficiency, Fractures)  3. At risk for impaired metabolic function Due to Maragret's current state of health and medical condition(s), she is at a significantly higher risk for impaired metabolic function.   At least 8 minutes was spent on counseling Suezette about these concerns today.  This places the patient at a much greater risk to subsequently develop cardio-pulmonary conditions that can negatively affect the patient's quality of life.  I stressed the importance of reversing these risks factors.  The initial goal is to lose at least 5-10% of starting weight to help reduce risk factors.  Counseling:  Intensive lifestyle modifications discussed with Chanetta as the most appropriate first line treatment.  she will continue to work on diet, exercise, and weight loss efforts.  We will continue to reassess these conditions on a fairly regular basis in an attempt to decrease the patient's overall  morbidity and mortality.  4. Obesity, current BMI 35.6  Course: Robynne is currently in the action stage of change. As such, her goal is to continue with weight loss efforts.   Nutrition goals: She has agreed to keeping a food journal and adhering to recommended goals of 1100-1400 calories and 80 grams of protein.   Exercise goals: All adults should avoid inactivity. Some physical activity is better than none, and adults who participate in any amount of physical activity gain some health benefits.  Behavioral modification strategies: increasing lean protein intake, decreasing simple carbohydrates, meal planning and cooking strategies, keeping healthy foods in the home and planning for success.  Litisha has agreed to follow-up with our clinic in 2 weeks after checking vitamin D today and restarting metformin. She was informed of the importance of frequent follow-up visits to maximize her success with intensive lifestyle modifications for her multiple health conditions.   Zarahi was informed we would discuss her lab results at her next visit unless there is a critical issue that needs to be addressed sooner. Vonita agreed to keep her next visit at the agreed upon time to discuss these results.  Objective:   Blood pressure 120/80, pulse 79, temperature 98.4 F (36.9 C), height 5\' 7"  (1.702 m), weight 227 lb (103 kg), SpO2 98 %. Body mass index is 35.55 kg/m.  General: Cooperative, alert, well developed, in no acute distress. HEENT: Conjunctivae and lids unremarkable. Cardiovascular: Regular rhythm.  Lungs: Normal work of breathing. Neurologic: No focal deficits.   Lab Results  Component Value Date   CREATININE 0.87 12/28/2020   BUN 8 12/28/2020   NA 142 12/28/2020   K 4.4 12/28/2020   CL 103 12/28/2020   CO2 28 12/28/2020   Lab Results  Component Value Date   ALT 18 12/28/2020   AST 21 12/28/2020   ALKPHOS 71 12/28/2020   BILITOT 0.6 12/28/2020   Lab Results   Component Value Date   HGBA1C 5.7 (H) 12/28/2020   Lab Results  Component Value Date   INSULIN 30.4 (H) 12/28/2020   Lab Results  Component Value Date   TSH 1.770 12/28/2020   Lab Results  Component Value Date   CHOL 190 12/28/2020   HDL 69 12/28/2020   LDLCALC 110 (H) 12/28/2020   TRIG 59 12/28/2020   Lab Results  Component Value Date   WBC 5.2 12/28/2020   HGB 14.2 12/28/2020   HCT 43.2 12/28/2020   MCV 82 12/28/2020   PLT 375 12/28/2020   Attestation Statements:   Reviewed by clinician on day of visit: allergies, medications, problem list, medical history, surgical history, family history, social history, and previous encounter notes.  I, 02/25/2021, CMA, am acting as Insurance claims handler for Energy manager, DO.  I have reviewed the above documentation for accuracy and completeness, and I agree with the above. [employer], D.O.  The 21st Century  Cures Act was signed into law in 2016 which includes the topic of electronic health records.  This provides immediate access to information in MyChart.  This includes consultation notes, operative notes, office notes, lab results and pathology reports.  If you have any questions about what you read please let us know at your next visit so we can discuss your concerns and take corrective action if need be.  We are right here with you.

## 2021-05-27 ENCOUNTER — Encounter (HOSPITAL_BASED_OUTPATIENT_CLINIC_OR_DEPARTMENT_OTHER): Payer: Self-pay | Admitting: General Surgery

## 2021-05-27 ENCOUNTER — Encounter (HOSPITAL_BASED_OUTPATIENT_CLINIC_OR_DEPARTMENT_OTHER)
Admission: RE | Admit: 2021-05-27 | Discharge: 2021-05-27 | Disposition: A | Discharge status: Home / Self Care | Payer: BC Managed Care – PPO | Source: Ambulatory Visit | Attending: General Surgery | Admitting: General Surgery

## 2021-05-27 ENCOUNTER — Other Ambulatory Visit: Payer: Self-pay

## 2021-05-27 DIAGNOSIS — Z01812 Encounter for preprocedural laboratory examination: Secondary | ICD-10-CM | POA: Diagnosis not present

## 2021-05-27 LAB — BASIC METABOLIC PANEL
Anion gap: 6 (ref 5–15)
BUN: 13 mg/dL (ref 6–20)
CO2: 25 mmol/L (ref 22–32)
Calcium: 9.3 mg/dL (ref 8.9–10.3)
Chloride: 106 mmol/L (ref 98–111)
Creatinine, Ser: 0.97 mg/dL (ref 0.44–1.00)
GFR, Estimated: >60 mL/min (ref >60)
Glucose, Bld: 78 mg/dL (ref 70–99)
Potassium: 4 mmol/L (ref 3.5–5.1)
Sodium: 137 mmol/L (ref 135–145)

## 2021-05-27 MED ORDER — ENSURE PRE-SURGERY PO LIQD: 296.0000 mL | Freq: Once | ORAL | Status: DC

## 2021-05-27 NOTE — Progress Notes (Signed)

## 2021-06-06 ENCOUNTER — Ambulatory Visit (INDEPENDENT_AMBULATORY_CARE_PROVIDER_SITE_OTHER): Payer: BC Managed Care – PPO | Admitting: Adult Health

## 2021-06-07 ENCOUNTER — Other Ambulatory Visit: Payer: Self-pay

## 2021-06-07 ENCOUNTER — Other Ambulatory Visit (INDEPENDENT_AMBULATORY_CARE_PROVIDER_SITE_OTHER): Payer: Self-pay | Admitting: Family Medicine

## 2021-06-07 ENCOUNTER — Encounter (HOSPITAL_BASED_OUTPATIENT_CLINIC_OR_DEPARTMENT_OTHER)
Admission: RE | Disposition: A | Discharge status: Home / Self Care | Payer: Self-pay | Source: Home / Self Care | Attending: General Surgery

## 2021-06-07 ENCOUNTER — Ambulatory Visit (HOSPITAL_BASED_OUTPATIENT_CLINIC_OR_DEPARTMENT_OTHER)
Admission: RE | Admit: 2021-06-07 | Discharge: 2021-06-07 | Disposition: A | Discharge status: Home / Self Care | Payer: BC Managed Care – PPO | Attending: General Surgery | Admitting: General Surgery

## 2021-06-07 ENCOUNTER — Encounter (HOSPITAL_BASED_OUTPATIENT_CLINIC_OR_DEPARTMENT_OTHER): Payer: Self-pay | Admitting: General Surgery

## 2021-06-07 ENCOUNTER — Ambulatory Visit (HOSPITAL_BASED_OUTPATIENT_CLINIC_OR_DEPARTMENT_OTHER): Payer: BC Managed Care – PPO | Admitting: Certified Registered"

## 2021-06-07 DIAGNOSIS — Z888 Allergy status to other drugs, medicaments and biological substances status: Secondary | ICD-10-CM | POA: Diagnosis not present

## 2021-06-07 DIAGNOSIS — R7303 Prediabetes: Secondary | ICD-10-CM

## 2021-06-07 DIAGNOSIS — Z886 Allergy status to analgesic agent status: Secondary | ICD-10-CM | POA: Insufficient documentation

## 2021-06-07 DIAGNOSIS — Z880 Allergy status to penicillin: Secondary | ICD-10-CM | POA: Diagnosis not present

## 2021-06-07 DIAGNOSIS — N6082 Other benign mammary dysplasias of left breast: Secondary | ICD-10-CM | POA: Insufficient documentation

## 2021-06-07 DIAGNOSIS — L720 Epidermal cyst: Secondary | ICD-10-CM | POA: Insufficient documentation

## 2021-06-07 DIAGNOSIS — N644 Mastodynia: Secondary | ICD-10-CM | POA: Insufficient documentation

## 2021-06-07 HISTORY — PX: BREAST CYST EXCISION: SHX579

## 2021-06-07 HISTORY — DX: Prediabetes: R73.03

## 2021-06-07 LAB — GLUCOSE, CAPILLARY
Glucose-Capillary: 80 mg/dL (ref 70–99)
Glucose-Capillary: 72 mg/dL (ref 70–99)

## 2021-06-07 SURGERY — EXCISION, CYST, BREAST
Anesthesia: General | Site: Breast | Laterality: Left

## 2021-06-07 MED ORDER — PROMETHAZINE HCL 25 MG/ML IJ SOLN: 6.2500 mg | INTRAMUSCULAR | Status: DC | PRN

## 2021-06-07 MED ORDER — PROPOFOL 10 MG/ML IV BOLUS
INTRAVENOUS | Status: DC | PRN
  Administered 2021-06-07: 200 mg via INTRAVENOUS

## 2021-06-07 MED ORDER — GABAPENTIN 300 MG PO CAPS
ORAL_CAPSULE | ORAL | Status: AC
  Filled 2021-06-07: qty 1

## 2021-06-07 MED ORDER — OXYCODONE HCL 5 MG PO TABS
5.0000 mg | ORAL_TABLET | Freq: Once | ORAL | Status: AC | PRN
Start: 2021-06-07 — End: 2021-06-07
  Administered 2021-06-07: 5 mg via ORAL

## 2021-06-07 MED ORDER — HYDROMORPHONE HCL 1 MG/ML IJ SOLN
INTRAMUSCULAR | Status: AC
  Filled 2021-06-07: qty 0.5

## 2021-06-07 MED ORDER — LIDOCAINE HCL (PF) 2 % IJ SOLN
INTRAMUSCULAR | Status: AC
  Filled 2021-06-07: qty 5

## 2021-06-07 MED ORDER — MIDAZOLAM HCL 5 MG/5ML IJ SOLN
INTRAMUSCULAR | Status: DC | PRN
  Administered 2021-06-07: 2 mg via INTRAVENOUS

## 2021-06-07 MED ORDER — HYDROMORPHONE HCL 1 MG/ML IJ SOLN
0.2500 mg | INTRAMUSCULAR | Status: DC | PRN
  Administered 2021-06-07 (×2): 0.25 mg via INTRAVENOUS

## 2021-06-07 MED ORDER — FENTANYL CITRATE (PF) 100 MCG/2ML IJ SOLN
INTRAMUSCULAR | Status: AC
  Filled 2021-06-07: qty 2

## 2021-06-07 MED ORDER — OXYCODONE HCL 5 MG/5ML PO SOLN: 5.0000 mg | Freq: Once | ORAL | Status: AC | PRN

## 2021-06-07 MED ORDER — GABAPENTIN 300 MG PO CAPS
300.0000 mg | ORAL_CAPSULE | ORAL | Status: AC
  Administered 2021-06-07: 300 mg via ORAL

## 2021-06-07 MED ORDER — EPHEDRINE 5 MG/ML INJ
INTRAVENOUS | Status: AC
  Filled 2021-06-07: qty 10

## 2021-06-07 MED ORDER — LACTATED RINGERS IV SOLN: INTRAVENOUS | Status: DC

## 2021-06-07 MED ORDER — OXYCODONE HCL 5 MG PO TABS
ORAL_TABLET | ORAL | Status: AC
  Filled 2021-06-07: qty 1

## 2021-06-07 MED ORDER — DEXAMETHASONE SODIUM PHOSPHATE 10 MG/ML IJ SOLN
INTRAMUSCULAR | Status: AC
  Filled 2021-06-07: qty 1

## 2021-06-07 MED ORDER — VANCOMYCIN HCL IN DEXTROSE 1-5 GM/200ML-% IV SOLN
1000.0000 mg | INTRAVENOUS | Status: AC
  Administered 2021-06-07: 1000 mg via INTRAVENOUS

## 2021-06-07 MED ORDER — ONDANSETRON HCL 4 MG/2ML IJ SOLN
INTRAMUSCULAR | Status: DC | PRN
  Administered 2021-06-07: 4 mg via INTRAVENOUS

## 2021-06-07 MED ORDER — BUPIVACAINE HCL (PF) 0.25 % IJ SOLN
INTRAMUSCULAR | Status: DC | PRN
  Administered 2021-06-07: 5 mL

## 2021-06-07 MED ORDER — DEXAMETHASONE SODIUM PHOSPHATE 10 MG/ML IJ SOLN
INTRAMUSCULAR | Status: DC | PRN
  Administered 2021-06-07: 5 mg via INTRAVENOUS

## 2021-06-07 MED ORDER — AMISULPRIDE (ANTIEMETIC) 5 MG/2ML IV SOLN: 10.0000 mg | Freq: Once | INTRAVENOUS | Status: DC | PRN

## 2021-06-07 MED ORDER — EPHEDRINE SULFATE 50 MG/ML IJ SOLN
INTRAMUSCULAR | Status: DC | PRN
  Administered 2021-06-07: 10 mg via INTRAVENOUS

## 2021-06-07 MED ORDER — VANCOMYCIN HCL IN DEXTROSE 1-5 GM/200ML-% IV SOLN
INTRAVENOUS | Status: AC
  Filled 2021-06-07: qty 200

## 2021-06-07 MED ORDER — PROPOFOL 10 MG/ML IV BOLUS
INTRAVENOUS | Status: AC
  Filled 2021-06-07: qty 20

## 2021-06-07 MED ORDER — MIDAZOLAM HCL 2 MG/2ML IJ SOLN
INTRAMUSCULAR | Status: AC
  Filled 2021-06-07: qty 2

## 2021-06-07 MED ORDER — FENTANYL CITRATE (PF) 100 MCG/2ML IJ SOLN
INTRAMUSCULAR | Status: DC | PRN
  Administered 2021-06-07: 25 ug via INTRAVENOUS

## 2021-06-07 MED ORDER — LIDOCAINE HCL (CARDIAC) PF 100 MG/5ML IV SOSY
PREFILLED_SYRINGE | INTRAVENOUS | Status: DC | PRN
  Administered 2021-06-07: 60 mg via INTRAVENOUS

## 2021-06-07 MED ORDER — MEPERIDINE HCL 25 MG/ML IJ SOLN: 6.2500 mg | INTRAMUSCULAR | Status: DC | PRN

## 2021-06-07 MED ORDER — ONDANSETRON HCL 4 MG/2ML IJ SOLN
INTRAMUSCULAR | Status: AC
  Filled 2021-06-07: qty 2

## 2021-06-07 SURGICAL SUPPLY — 41 items
ADH SKN CLS APL DERMABOND .7 (GAUZE/BANDAGES/DRESSINGS) ×1
APL PRP STRL LF DISP 70% ISPRP (MISCELLANEOUS) ×1
BLADE SURG 15 STRL LF DISP TIS (BLADE) ×1 IMPLANT
BLADE SURG 15 STRL SS (BLADE) ×3
CHLORAPREP W/TINT 26 (MISCELLANEOUS) ×3 IMPLANT
CLOSURE WOUND 1/2 X4 (GAUZE/BANDAGES/DRESSINGS) ×1
COVER BACK TABLE 60X90IN (DRAPES) ×3 IMPLANT
COVER MAYO STAND STRL (DRAPES) ×3 IMPLANT
DERMABOND ADVANCED (GAUZE/BANDAGES/DRESSINGS) ×2
DERMABOND ADVANCED .7 DNX12 (GAUZE/BANDAGES/DRESSINGS) IMPLANT
DRAPE LAPAROSCOPIC ABDOMINAL (DRAPES) ×3 IMPLANT
DRAPE UTILITY XL STRL (DRAPES) ×3 IMPLANT
DRSG TEGADERM 4X4.75 (GAUZE/BANDAGES/DRESSINGS) ×3 IMPLANT
ELECT COATED BLADE 2.86 ST (ELECTRODE) ×3 IMPLANT
ELECT REM PT RETURN 9FT ADLT (ELECTROSURGICAL) ×3
ELECTRODE REM PT RTRN 9FT ADLT (ELECTROSURGICAL) ×1 IMPLANT
GAUZE SPONGE 4X4 12PLY STRL LF (GAUZE/BANDAGES/DRESSINGS) ×3 IMPLANT
GLOVE SURG ENC MOIS LTX SZ7 (GLOVE) ×3 IMPLANT
GLOVE SURG UNDER POLY LF SZ7.5 (GLOVE) ×3 IMPLANT
GOWN STRL REUS W/ TWL LRG LVL3 (GOWN DISPOSABLE) ×3 IMPLANT
GOWN STRL REUS W/TWL LRG LVL3 (GOWN DISPOSABLE) ×6
KIT MARKER MARGIN INK (KITS) ×3 IMPLANT
NDL HYPO 25X1 1.5 SAFETY (NEEDLE) ×1 IMPLANT
NEEDLE HYPO 25X1 1.5 SAFETY (NEEDLE) ×3 IMPLANT
PACK BASIN DAY SURGERY FS (CUSTOM PROCEDURE TRAY) ×3 IMPLANT
PENCIL SMOKE EVACUATOR (MISCELLANEOUS) ×3 IMPLANT
SLEEVE SCD COMPRESS KNEE MED (STOCKING) ×3 IMPLANT
SPONGE LAP 4X18 RFD (DISPOSABLE) ×3 IMPLANT
STRIP CLOSURE SKIN 1/2X4 (GAUZE/BANDAGES/DRESSINGS) ×2 IMPLANT
SUT ETHILON 3 0 PS 1 (SUTURE) ×2 IMPLANT
SUT MNCRL AB 4-0 PS2 18 (SUTURE) ×2 IMPLANT
SUT SILK 2 0 SH (SUTURE) ×1 IMPLANT
SUT VIC AB 2-0 SH 27 (SUTURE) ×6
SUT VIC AB 2-0 SH 27XBRD (SUTURE) ×1 IMPLANT
SUT VIC AB 3-0 SH 27 (SUTURE) ×3
SUT VIC AB 3-0 SH 27X BRD (SUTURE) ×1 IMPLANT
SYR CONTROL 10ML LL (SYRINGE) ×3 IMPLANT
TOWEL GREEN STERILE FF (TOWEL DISPOSABLE) ×3 IMPLANT
TUBE CONNECTING 20'X1/4 (TUBING) ×1
TUBE CONNECTING 20X1/4 (TUBING) ×1 IMPLANT
YANKAUER SUCT BULB TIP NO VENT (SUCTIONS) ×2 IMPLANT

## 2021-06-07 NOTE — Anesthesia Procedure Notes (Signed)
Procedure Name: LMA Insertion Date/Time: 06/07/2021 12:30 PM Performed by: Lauralyn Primes, CRNA Pre-anesthesia Checklist: Patient identified, Emergency Drugs available, Suction available and Patient being monitored Patient Re-evaluated:Patient Re-evaluated prior to induction Oxygen Delivery Method: Circle system utilized Preoxygenation: Pre-oxygenation with 100% oxygen Induction Type: IV induction Ventilation: Mask ventilation without difficulty LMA: LMA inserted LMA Size: 4.0 Number of attempts: 1 Airway Equipment and Method: Bite block Placement Confirmation: positive ETCO2 Tube secured with: Tape Dental Injury: Teeth and Oropharynx as per pre-operative assessment

## 2021-06-07 NOTE — Op Note (Signed)
Preoperative diagnosis: Recurrent left breast sebaceous cyst x2 Postoperative diagnosis: Same as above Procedure: Excision of left breast sebaceous cyst and old scar, left breast, 5 x 3 cm Surgeon: Dr. Harden Mo Anesthesia: General Estimated blood loss: Minimal Specimens: Left breast sebaceous cyst x2 and surrounding scar Complications: None Drains: None Special count was correct completion Disposition to recovery in stable condition  Indications:49 yof s/p prior excision of ruptured sebaceous cyst in left breast. had sutures out then wound opened. area eventually healed. she has been doing well. the scar is hypertrophic. she has been having pain at scar as well as in left breast for past six months no other mass or dc.  referred back for possible recurrence.  recent mm is negative and an US showed an intradermal mass she would like to consider excision since then another area has appeared right next to it.  We discussed excision of both of these areas as well as her old scar.  Procedure: After informed consent was obtained the patient was taken to the operating.  She was given antibiotics.  SCDs were in place.  She was placed under general esthesia without complication.  She was prepped and draped in the standard sterile surgical fashion.  A surgical timeout was then performed.  I made an elliptical incision after infiltrating Marcaine that surrounded both of the sebaceous cyst with what appeared to be their skin connections as well as her old scar.  I then used the knife and cautery to excise all of this tissue down to healthy fat.  There was no more abnormal tissue remaining.  I then passed this off the table.  I then mobilized the tissue.  I obtained hemostasis.  I closed the deep tissue with 2-0 Vicryl.  The skin was closed with 3-0 Vicryl and 4-0 Monocryl.  I did place three 3-0 nylon sutures.  Dressings were placed.  She tolerated this well was extubated and transferred to recovery  stable.

## 2021-06-07 NOTE — H&P (Signed)
Heather Lee is an 50 y.o. female.   Chief Complaint: cyst HPI:  8 yof s/p prior excision of ruptured sebaceous cyst in left breast. had sutures out then wound opened. area eventually healed. she has been doing well. the scar is hypertrophic. she has been having pain at scar as well as in left breast for past six months no other mass or dc.  referred back for possible recurrence.  recent mm is negative and an US showed an intradermal mass she would like to consider excision   Past Medical History:  Diagnosis Date   Anemia    Anxiety    Back pain    Bilateral swelling of feet    Depression    Fatigue    Hypertension    Lactose intolerance    Leg pain    Lower back pain    Poor circulation of extremity    feet   Poor memory    Pre-diabetes    Rheumatoid arthritis (HCC)    Sebaceous cyst of breast, left    Shortness of breath on exertion    Thyromegaly     Past Surgical History:  Procedure Laterality Date   BACK SURGERY     BREAST CYST EXCISION Left 07/02/2018   Procedure: LEFT BREAST SEBACEOUS CYST EXCISION;  Surgeon: [name], MD;  Location: [facility];  Service: General;  Laterality: Left;   HYSTEROTOMY  08/31/2018    Family History  Problem Relation Age of Onset   Hypertension Mother    Anxiety disorder Mother    Hypertension Father    Stroke Father    Kidney disease Father    Social History:  reports that she has never smoked. She has never used smokeless tobacco. She reports current alcohol use. She reports that she does not use drugs.  Allergies:  Allergies  Allergen Reactions   Ace Inhibitors Other (See Comments)    cough   Amlodipine     Other reaction(s): nausea, headache   Diclofenac     Other reaction(s): rash   Ibuprofen     Other reaction(s): rash   Other     Dairy    Penicillins Hives and Rash    No medications prior to admission.    No results found for this or any previous visit (from the past 48  hour(s)). No results found.  Review of Systems  All other systems reviewed and are negative.  Height 5\' 7"  (1.702 m), weight 102.1 kg, last menstrual period 06/07/2018. Physical Exam  Breast Note:  hypertrophic left medial breast scar with mass present General nad Cv rrr  Pulm nl effort  Assessment/Plan CYST, BREAST, SEBACEOUS, LEFT (N60.82) Story: not sure if this is related to scar still or sebaceous cyst but I think needs to be excised. discussed excision at surgery center and will remove old scar. hopefully without infection will heal better this time. will plan to do around one of her school breaks  06/09/2018, MD 06/07/2021, 7:30 AM

## 2021-06-07 NOTE — Anesthesia Postprocedure Evaluation (Signed)
Anesthesia Post Note  Patient: Heather Lee  Procedure(s) Performed: LEFT BREAST SEBACEOUS CYST EXCISION (Left: Breast)     Patient location during evaluation: PACU Anesthesia Type: General Level of consciousness: awake and alert Pain management: pain level controlled Vital Signs Assessment: post-procedure vital signs reviewed and stable Respiratory status: spontaneous breathing, nonlabored ventilation and respiratory function stable Cardiovascular status: blood pressure returned to baseline and stable Postop Assessment: no apparent nausea or vomiting Anesthetic complications: no   No notable events documented.  Last Vitals:  Vitals:   06/07/21 1400 06/07/21 1415  BP: 133/84 (!) 150/95  Pulse: 63 75  Resp: 14 16  Temp:  36.7 C  SpO2: 97% 96%    Last Pain:  Vitals:   06/07/21 1411  TempSrc:   PainSc: 4                  Lowella Curb

## 2021-06-07 NOTE — Interval H&P Note (Signed)
History and Physical Interval Note:  06/07/2021 12:07 PM  Heather Lee  has presented today for surgery, with the diagnosis of LEFT BREAST SEBACEOUS CYST.  The various methods of treatment have been discussed with the patient and family. After consideration of risks, benefits and other options for treatment, the patient has consented to  Procedure(s): LEFT BREAST SEBACEOUS CYST EXCISION (Left) as a surgical intervention.  The patient's history has been reviewed, patient examined, no change in status, stable for surgery.  I have reviewed the patient's chart and labs.  Questions were answered to the patient's satisfaction.     [name]

## 2021-06-07 NOTE — Transfer of Care (Signed)
Immediate Anesthesia Transfer of Care Note  Patient: Heather Lee  Procedure(s) Performed: LEFT BREAST SEBACEOUS CYST EXCISION (Left: Breast)  Patient Location: PACU  Anesthesia Type:General  Level of Consciousness: drowsy  Airway & Oxygen Therapy: Patient Spontanous Breathing and Patient connected to face mask oxygen  Post-op Assessment: Report given to RN and Post -op Vital signs reviewed and stable  Post vital signs: Reviewed and stable  Last Vitals:  Vitals Value Taken Time  BP 142/93 06/07/21 1311  Temp    Pulse 68 06/07/21 1312  Resp 15 06/07/21 1312  SpO2 100 % 06/07/21 1312  Vitals shown include unvalidated device data.  Last Pain:  Vitals:   06/07/21 1114  TempSrc: Oral  PainSc: 0-No pain         Complications: No notable events documented.

## 2021-06-07 NOTE — Discharge Instructions (Addendum)
[facility] Office Phone Number [phone] POST OP INSTRUCTIONS Take 400 mg of ibuprofen every 8 hours or 650 mg tylenol every 6 hours for next 72 hours then as needed. Use ice several times daily also. Always review your discharge instruction sheet given to you by the facility where your surgery was performed.  IF YOU HAVE DISABILITY OR FAMILY LEAVE FORMS, YOU MUST BRING THEM TO THE OFFICE FOR PROCESSING.  DO NOT GIVE THEM TO YOUR DOCTOR.  A prescription for pain medication may be given to you upon discharge.  Take your pain medication as prescribed, if needed.  If narcotic pain medicine is not needed, then you may take acetaminophen (Tylenol), naprosyn (Alleve) or ibuprofen (Advil) as needed. Take your usually prescribed medications unless otherwise directed If you need a refill on your pain medication, please contact your pharmacy.  They will contact our office to request authorization.  Prescriptions will not be filled after 5pm or on week-ends. You should eat very light the first 24 hours after surgery, such as soup, crackers, pudding, etc.  Resume your normal diet the day after surgery. Most patients will experience some swelling and bruising in the breast.  Ice packs and a good support bra will help.  Wear the breast binder provided or a sports bra for 72 hours day and night.  After that wear a sports bra during the day until you return to the office. Swelling and bruising can take several days to resolve.  It is common to experience some constipation if taking pain medication after surgery.  Increasing fluid intake and taking a stool softener will usually help or prevent this problem from occurring.  A mild laxative (Milk of Magnesia or Miralax) should be taken according to package directions if there are no bowel movements after 48 hours. Unless discharge instructions indicate otherwise, you may remove your bandages 48 hours after surgery and you may shower at that time.  You  may have steri-strips (small skin tapes) in place directly over the incision.  These strips should be left on the skin for 7-10 days and will come off on their own.  If your surgeon used skin glue on the incision, you may shower in 24 hours.  The glue will flake off over the next 2-3 weeks.  Any sutures or staples will be removed at the office during your follow-up visit. ACTIVITIES:  You may resume regular daily activities (gradually increasing) beginning the next day.  Wearing a good support bra or sports bra minimizes pain and swelling.  You may have sexual intercourse when it is comfortable. You may drive when you no longer are taking prescription pain medication, you can comfortably wear a seatbelt, and you can safely maneuver your car and apply brakes. RETURN TO WORK:  ______________________________________________________________________________________ You should see your doctor in the office for a follow-up appointment approximately two weeks after your surgery.  Your doctor's nurse will typically make your follow-up appointment when she calls you with your pathology report.  Expect your pathology report 3-4 business days after your surgery.  You may call to check if you do not hear from us after three days. OTHER INSTRUCTIONS: _______________________________________________________________________________________________ _____________________________________________________________________________________________________________________________________ _____________________________________________________________________________________________________________________________________ _____________________________________________________________________________________________________________________________________  WHEN TO CALL DR WAKEFIELD: Fever over 101.0 Nausea and/or vomiting. Extreme swelling or bruising. Continued bleeding from incision. Increased pain, redness, or drainage from the  incision.  The clinic staff is available to answer your questions during regular business hours.  Please don't hesitate to call and ask to speak to   one of the nurses for clinical concerns.  If you have a medical emergency, go to the nearest emergency room or call 911.  A surgeon from [facility] is always on call at the hospital.  For further questions, please visit [email or web address]     Post Anesthesia Home Care Instructions  Activity: Get plenty of rest for the remainder of the day. A responsible individual must stay with you for 24 hours following the procedure.  For the next 24 hours, DO NOT: -Drive a car -Operate machinery -Drink alcoholic beverages -Take any medication unless instructed by your physician -Make any legal decisions or sign important papers.  Meals: Start with liquid foods such as gelatin or soup. Progress to regular foods as tolerated. Avoid greasy, spicy, heavy foods. If nausea and/or vomiting occur, drink only clear liquids until the nausea and/or vomiting subsides. Call your physician if vomiting continues.  Special Instructions/Symptoms: Your throat may feel dry or sore from the anesthesia or the breathing tube placed in your throat during surgery. If this causes discomfort, gargle with warm salt water. The discomfort should disappear within 24 hours.  If you had a scopolamine patch placed behind your ear for the management of post- operative nausea and/or vomiting:  1. The medication in the patch is effective for 72 hours, after which it should be removed.  Wrap patch in a tissue and discard in the trash. Wash hands thoroughly with soap and water. 2. You may remove the patch earlier than 72 hours if you experience unpleasant side effects which may include dry mouth, dizziness or visual disturbances. 3. Avoid touching the patch. Wash your hands with soap and water after contact with the patch.     

## 2021-06-07 NOTE — Anesthesia Preprocedure Evaluation (Signed)
Anesthesia Evaluation  Patient identified by MRN, date of birth, ID band Patient awake    Reviewed: Allergy & Precautions, H&P , NPO status , Patient's Chart, lab work & pertinent test results, reviewed documented beta blocker date and time   Airway Mallampati: II  TM Distance: >3 FB Neck ROM: full    Dental no notable dental hx.    Pulmonary neg pulmonary ROS,    Pulmonary exam normal breath sounds clear to auscultation       Cardiovascular Exercise Tolerance: Good hypertension, Pt. on medications negative cardio ROS   Rhythm:regular Rate:Normal     Neuro/Psych Anxiety Depression negative neurological ROS  negative psych ROS   GI/Hepatic negative GI ROS, Neg liver ROS,   Endo/Other  negative endocrine ROS  Renal/GU negative Renal ROS  negative genitourinary   Musculoskeletal  (+) Arthritis , Osteoarthritis,    Abdominal (+) + obese,   Peds  Hematology negative hematology ROS (+)   Anesthesia Other Findings   Reproductive/Obstetrics negative OB ROS                             Anesthesia Physical  Anesthesia Plan  ASA: II  Anesthesia Plan: General   Post-op Pain Management:    Induction: Intravenous  PONV Risk Score and Plan: 3 and Ondansetron, Dexamethasone, Treatment may vary due to age or medical condition and Midazolam  Airway Management Planned: LMA  Additional Equipment:   Intra-op Plan:   Post-operative Plan: Extubation in OR  Informed Consent: I have reviewed the patients History and Physical, chart, labs and discussed the procedure including the risks, benefits and alternatives for the proposed anesthesia with the patient or authorized representative who has indicated his/her understanding and acceptance.     Dental Advisory Given  Plan Discussed with: CRNA, Surgeon and Anesthesiologist  Anesthesia Plan Comments: ( )        Anesthesia Quick  Evaluation

## 2021-06-08 ENCOUNTER — Encounter (HOSPITAL_BASED_OUTPATIENT_CLINIC_OR_DEPARTMENT_OTHER): Payer: Self-pay | Admitting: General Surgery

## 2021-06-10 LAB — SURGICAL PATHOLOGY

## 2021-06-20 ENCOUNTER — Other Ambulatory Visit (INDEPENDENT_AMBULATORY_CARE_PROVIDER_SITE_OTHER): Payer: Self-pay | Admitting: Family Medicine

## 2021-06-20 DIAGNOSIS — R7303 Prediabetes: Secondary | ICD-10-CM

## 2021-06-20 NOTE — Telephone Encounter (Signed)
Last OV with Dr Opalski 

## 2021-06-21 ENCOUNTER — Ambulatory Visit (INDEPENDENT_AMBULATORY_CARE_PROVIDER_SITE_OTHER): Payer: BC Managed Care – PPO | Admitting: Physician Assistant

## 2021-07-16 ENCOUNTER — Other Ambulatory Visit (INDEPENDENT_AMBULATORY_CARE_PROVIDER_SITE_OTHER): Payer: Self-pay | Admitting: Family Medicine

## 2021-07-16 DIAGNOSIS — R7303 Prediabetes: Secondary | ICD-10-CM

## 2021-07-18 NOTE — Telephone Encounter (Signed)
Last OV with Dr Opalski 

## 2021-07-21 ENCOUNTER — Other Ambulatory Visit: Payer: Self-pay

## 2021-07-21 ENCOUNTER — Ambulatory Visit (INDEPENDENT_AMBULATORY_CARE_PROVIDER_SITE_OTHER): Payer: BC Managed Care – PPO | Admitting: Adult Health

## 2021-07-21 ENCOUNTER — Encounter (INDEPENDENT_AMBULATORY_CARE_PROVIDER_SITE_OTHER): Payer: Self-pay | Admitting: Adult Health

## 2021-07-21 VITALS — BP 145/80 | HR 82 | Temp 98.1°F | Ht 67.0 in | Wt 236.0 lb

## 2021-07-21 DIAGNOSIS — I10 Essential (primary) hypertension: Secondary | ICD-10-CM

## 2021-07-21 DIAGNOSIS — E559 Vitamin D deficiency, unspecified: Secondary | ICD-10-CM

## 2021-07-21 DIAGNOSIS — Z6836 Body mass index (BMI) 36.0-36.9, adult: Secondary | ICD-10-CM

## 2021-07-21 DIAGNOSIS — R7303 Prediabetes: Secondary | ICD-10-CM | POA: Diagnosis not present

## 2021-07-21 DIAGNOSIS — Z9189 Other specified personal risk factors, not elsewhere classified: Secondary | ICD-10-CM

## 2021-07-21 MED ORDER — METFORMIN HCL 500 MG PO TABS: 500.0000 mg | ORAL_TABLET | Freq: Every day | ORAL | 0 refills | Status: DC

## 2021-07-21 MED ORDER — VITAMIN D (ERGOCALCIFEROL) 1.25 MG (50000 UNIT) PO CAPS: 50000.0000 [IU] | ORAL_CAPSULE | ORAL | 0 refills | Status: DC

## 2021-07-22 LAB — COMPREHENSIVE METABOLIC PANEL
ALT: 19 IU/L (ref 0–32)
AST: 21 IU/L (ref 0–40)
Albumin/Globulin Ratio: 1.7 (ref 1.2–2.2)
Albumin: 4.3 g/dL (ref 3.8–4.9)
Alkaline Phosphatase: 69 IU/L (ref 44–121)
BUN/Creatinine Ratio: 13 (ref 9–23)
BUN: 10 mg/dL (ref 6–24)
Bilirubin Total: 0.4 mg/dL (ref 0.0–1.2)
CO2: 25 mmol/L (ref 20–29)
Calcium: 9.1 mg/dL (ref 8.7–10.2)
Chloride: 106 mmol/L (ref 96–106)
Creatinine, Ser: 0.76 mg/dL (ref 0.57–1.00)
Globulin, Total: 2.5 g/dL (ref 1.5–4.5)
Glucose: 89 mg/dL (ref 65–99)
Potassium: 4.3 mmol/L (ref 3.5–5.2)
Sodium: 145 mmol/L — ABNORMAL HIGH (ref 134–144)
Total Protein: 6.8 g/dL (ref 6.0–8.5)
eGFR: 94 mL/min/{1.73_m2} (ref >59)

## 2021-07-22 LAB — HEMOGLOBIN A1C
Est. average glucose Bld gHb Est-mCnc: 117 mg/dL
Hgb A1c MFr Bld: 5.7 % — ABNORMAL HIGH (ref 4.8–5.6)

## 2021-07-22 LAB — INSULIN, RANDOM: INSULIN: 32.9 u[IU]/mL — ABNORMAL HIGH (ref 2.6–24.9)

## 2021-07-22 LAB — VITAMIN D 25 HYDROXY (VIT D DEFICIENCY, FRACTURES): Vit D, 25-Hydroxy: 43.4 ng/mL (ref 30.0–100.0)

## 2021-07-22 LAB — VITAMIN B12: Vitamin B-12: 717 pg/mL (ref 232–1245)

## 2021-07-26 NOTE — Progress Notes (Signed)
Chief Complaint:   OBESITY Heather Lee is here to discuss her progress with her obesity treatment plan along with follow-up of her obesity related diagnoses. Heather Lee is on keeping a food journal and adhering to recommended goals of 1100-1400 calories and 80 grams of protein and states she is following her eating plan approximately 0% of the time. Heather Lee states she is not exercising regularly at this time (just had surgery.  Today's visit was #: 9 Starting weight: 232 lbs Starting date: 12/28/2020 Today's weight: 236 lbs Today's date: 07/21/2021 Total lbs lost to date: 0 Total lbs lost since last in-office visit: 0  Interim History: Heather Lee had a left breast sebaceous cyst excised on 06/07/2021, and has her second follow-up tomorrow.   She reports that the site is healing well.  Subjective:   1. Vitamin D deficiency Vitamin D level on 05/16/2021 was 47.0.  Just below goal of 50.  She is currently taking prescription vitamin D 50,000 IU each week. She denies nausea, vomiting or muscle weakness.  Lab Results  Component Value Date   VD25OH 43.4 07/21/2021   VD25OH 47.0 05/16/2021   VD25OH 30.5 12/28/2020   2. Pre-diabetes On 12/28/2020, A1c was 5.7, blood glucose 82, insulin 30.4.  she has been off metformin since June 2022.  3. Essential hypertension She has not taken Benicar 20 mg.  She denies cardiac symptoms.  BP Readings from Last 3 Encounters:  07/21/21 (!) 145/80  06/07/21 (!) 150/95  05/16/21 120/80   4. At risk for diarrhea Heather Lee is at higher risk of diarrhea due to restarting metformin for prediabetes.  Assessment/Plan:   1. Vitamin D deficiency Low Vitamin D level contributes to fatigue and are associated with obesity, breast, and colon cancer. She agrees to continue to take prescription Vitamin D @50 ,000 IU every week and will check vitamin D level today.  - VITAMIN D 25 Hydroxy (Vit-D Deficiency, Fractures) - Refill Vitamin D, Ergocalciferol,  (DRISDOL) 1.25 MG (50000 UNIT) CAPS capsule; Take 1 capsule (50,000 Units total) by mouth every 7 (seven) days.  Dispense: 4 capsule; Refill: 0  2. Pre-diabetes Heather Lee will continue to work on weight loss, exercise, and decreasing simple carbohydrates to help decrease the risk of diabetes.  Restart metformin 500 mg at breakfast.  Check labs today.  - Hemoglobin A1c - Insulin, random - Vitamin B12 - Restart metFORMIN (GLUCOPHAGE) 500 MG tablet; Take 1 tablet (500 mg total) by mouth daily with breakfast.  Dispense: 30 tablet; Refill: 0  3. Essential hypertension Heather Lee is working on healthy weight loss and exercise to improve blood pressure control. We will watch for signs of hypotension as she continues her lifestyle modifications.  Will check labs today.  - Comprehensive metabolic panel  4. At risk for diarrhea Heather Lee was given approximately 15 minutes of diarrhea prevention counseling today. She is 50 y.o. female and has risk factors for diarrhea including medications and changes in diet. We discussed intensive lifestyle modifications today with an emphasis on specific weight loss instructions including dietary strategies.   Repetitive spaced learning was employed today to elicit superior memory formation and behavioral change.   5. Obesity, current BMI 37.0  Heather Lee is currently in the action stage of change. As such, her goal is to continue with weight loss efforts. She has agreed to keeping a food journal and adhering to recommended goals of 1100-1400 calories and 80 grams of protein.   Handout:  High Protein/Low Calorie sheet.  Protein of Food list.  Exercise  goals:  Recovering from surgery.  Increase daily walking as tolerated.  Behavioral modification strategies: increasing lean protein intake, decreasing simple carbohydrates, meal planning and cooking strategies, keeping healthy foods in the home, planning for success, and keeping a strict food  journal.  Heather Lee has agreed to follow-up with our clinic in 2 weeks. She was informed of the importance of frequent follow-up visits to maximize her success with intensive lifestyle modifications for her multiple health conditions.   Heather Lee was informed we would discuss her lab results at her next visit unless there is a critical issue that needs to be addressed sooner. Heather Lee agreed to keep her next visit at the agreed upon time to discuss these results.  Objective:   Blood pressure (!) 145/80, pulse 82, temperature 98.1 F (36.7 C), height 5\' 7"  (1.702 m), weight 236 lb (107 kg), last menstrual period 06/07/2018, SpO2 98 %. Body mass index is 36.96 kg/m.  General: Cooperative, alert, well developed, in no acute distress. HEENT: Conjunctivae and lids unremarkable. Cardiovascular: Regular rhythm.  Lungs: Normal work of breathing. Neurologic: No focal deficits.   Lab Results  Component Value Date   CREATININE 0.76 07/21/2021   BUN 10 07/21/2021   NA 145 (H) 07/21/2021   K 4.3 07/21/2021   CL 106 07/21/2021   CO2 25 07/21/2021   Lab Results  Component Value Date   ALT 19 07/21/2021   AST 21 07/21/2021   ALKPHOS 69 07/21/2021   BILITOT 0.4 07/21/2021   Lab Results  Component Value Date   HGBA1C 5.7 (H) 07/21/2021   HGBA1C 5.7 (H) 12/28/2020   Lab Results  Component Value Date   INSULIN 32.9 (H) 07/21/2021   INSULIN 30.4 (H) 12/28/2020   Lab Results  Component Value Date   TSH 1.770 12/28/2020   Lab Results  Component Value Date   CHOL 190 12/28/2020   HDL 69 12/28/2020   LDLCALC 110 (H) 12/28/2020   TRIG 59 12/28/2020   Lab Results  Component Value Date   VD25OH 43.4 07/21/2021   VD25OH 47.0 05/16/2021   VD25OH 30.5 12/28/2020   Lab Results  Component Value Date   WBC 5.2 12/28/2020   HGB 14.2 12/28/2020   HCT 43.2 12/28/2020   MCV 82 12/28/2020   PLT 375 12/28/2020   Attestation Statements:   Reviewed by clinician on day of visit:  allergies, medications, problem list, medical history, surgical history, family history, social history, and previous encounter notes.  I, 02/25/2021, CMA, am acting as Insurance claims handler for Energy manager, NP.  I have reviewed the above documentation for accuracy and completeness, and I agree with the above. -  Taylour Lietzke d. Masashi Snowdon, NP-C

## 2021-08-02 ENCOUNTER — Other Ambulatory Visit: Payer: Self-pay | Admitting: Obstetrics and Gynecology

## 2021-08-08 ENCOUNTER — Ambulatory Visit (INDEPENDENT_AMBULATORY_CARE_PROVIDER_SITE_OTHER): Payer: BC Managed Care – PPO | Admitting: Adult Health

## 2021-08-17 ENCOUNTER — Other Ambulatory Visit (INDEPENDENT_AMBULATORY_CARE_PROVIDER_SITE_OTHER): Payer: Self-pay | Admitting: Adult Health

## 2021-08-17 DIAGNOSIS — R7303 Prediabetes: Secondary | ICD-10-CM

## 2021-08-18 ENCOUNTER — Other Ambulatory Visit (INDEPENDENT_AMBULATORY_CARE_PROVIDER_SITE_OTHER): Payer: Self-pay | Admitting: Adult Health

## 2021-08-18 DIAGNOSIS — E559 Vitamin D deficiency, unspecified: Secondary | ICD-10-CM

## 2021-08-18 NOTE — Telephone Encounter (Signed)
Pt last seen by Katy Danford, FNP.  

## 2021-08-22 ENCOUNTER — Encounter (INDEPENDENT_AMBULATORY_CARE_PROVIDER_SITE_OTHER): Payer: Self-pay

## 2022-01-02 ENCOUNTER — Encounter (INDEPENDENT_AMBULATORY_CARE_PROVIDER_SITE_OTHER): Payer: Self-pay | Admitting: Family Medicine

## 2022-01-02 ENCOUNTER — Ambulatory Visit (INDEPENDENT_AMBULATORY_CARE_PROVIDER_SITE_OTHER): Payer: BC Managed Care – PPO | Admitting: Family Medicine

## 2022-01-02 ENCOUNTER — Other Ambulatory Visit: Payer: Self-pay

## 2022-01-02 VITALS — BP 116/79 | HR 86 | Temp 97.7°F | Ht 67.0 in | Wt 238.0 lb

## 2022-01-02 DIAGNOSIS — R7303 Prediabetes: Secondary | ICD-10-CM

## 2022-01-02 DIAGNOSIS — E559 Vitamin D deficiency, unspecified: Secondary | ICD-10-CM

## 2022-01-02 DIAGNOSIS — E7849 Other hyperlipidemia: Secondary | ICD-10-CM | POA: Diagnosis not present

## 2022-01-02 DIAGNOSIS — Z6837 Body mass index (BMI) 37.0-37.9, adult: Secondary | ICD-10-CM

## 2022-01-02 DIAGNOSIS — Z6836 Body mass index (BMI) 36.0-36.9, adult: Secondary | ICD-10-CM

## 2022-01-02 DIAGNOSIS — I10 Essential (primary) hypertension: Secondary | ICD-10-CM

## 2022-01-02 MED ORDER — METFORMIN HCL 500 MG PO TABS: 500.0000 mg | ORAL_TABLET | Freq: Every day | ORAL | 0 refills | Status: DC

## 2022-01-03 LAB — COMPREHENSIVE METABOLIC PANEL
ALT: 21 IU/L (ref 0–32)
AST: 22 IU/L (ref 0–40)
Albumin/Globulin Ratio: 1.7 (ref 1.2–2.2)
Albumin: 4.3 g/dL (ref 3.8–4.9)
Alkaline Phosphatase: 71 IU/L (ref 44–121)
BUN/Creatinine Ratio: 10 (ref 9–23)
BUN: 8 mg/dL (ref 6–24)
Bilirubin Total: 0.5 mg/dL (ref 0.0–1.2)
CO2: 26 mmol/L (ref 20–29)
Calcium: 9.2 mg/dL (ref 8.7–10.2)
Chloride: 102 mmol/L (ref 96–106)
Creatinine, Ser: 0.78 mg/dL (ref 0.57–1.00)
Globulin, Total: 2.6 g/dL (ref 1.5–4.5)
Glucose: 95 mg/dL (ref 70–99)
Potassium: 4.2 mmol/L (ref 3.5–5.2)
Sodium: 140 mmol/L (ref 134–144)
Total Protein: 6.9 g/dL (ref 6.0–8.5)
eGFR: 91 mL/min/{1.73_m2} (ref >59)

## 2022-01-03 LAB — LIPID PANEL WITH LDL/HDL RATIO
Cholesterol, Total: 175 mg/dL (ref 100–199)
HDL: 60 mg/dL (ref >39)
LDL Chol Calc (NIH): 104 mg/dL — ABNORMAL HIGH (ref 0–99)
LDL/HDL Ratio: 1.7 ratio (ref 0.0–3.2)
Triglycerides: 59 mg/dL (ref 0–149)
VLDL Cholesterol Cal: 11 mg/dL (ref 5–40)

## 2022-01-03 LAB — VITAMIN D 25 HYDROXY (VIT D DEFICIENCY, FRACTURES): Vit D, 25-Hydroxy: 53.3 ng/mL (ref 30.0–100.0)

## 2022-01-03 LAB — INSULIN, RANDOM: INSULIN: 36.4 u[IU]/mL — ABNORMAL HIGH (ref 2.6–24.9)

## 2022-01-03 LAB — HEMOGLOBIN A1C
Est. average glucose Bld gHb Est-mCnc: 111 mg/dL
Hgb A1c MFr Bld: 5.5 % (ref 4.8–5.6)

## 2022-01-03 NOTE — Progress Notes (Signed)
Chief Complaint:   OBESITY Heather Lee is here to discuss her progress with her obesity treatment plan along with follow-up of her obesity related diagnoses. Heather Lee is on keeping a food journal and adhering to recommended goals of 1100-1400 calories and 80 grams protein and states Heather Lee is following her eating plan approximately 0% of the time. Heather Lee states Heather Lee is not currently exercising.  Today's visit was #: 10 Starting weight: 232 lbs Starting date: 12/28/2020 Today's weight: 238 lbs Today's date: 01/02/2022 Total lbs lost to date: 0 Total lbs lost since last in-office visit: 0  Interim History: Heather Lee is returning for the first time since July. Heather Lee just had an annual visit to PCP and Gyn. Heather Lee wants to restart. Heather Lee feels like her sweet intake is out of control. Heather Lee went to a bariatric surgery seminar recently. Heather Lee maybe wants a more structured plan.  Subjective:   1. Prediabetes Heather Lee's last A1c was 5.7 with an insulin level of 32.9. Heather Lee is on Metformin and denies GI side effects.  2. Vitamin D deficiency Heather Lee denies nausea, vomiting, and muscle weakness but notes fatigue. Her las Vit D level was 43.4.  3. Essential hypertension BP controlled today. Heather Lee denies chest pain/chest pressure/headache. Heather Lee is on Benicar.  4. Other hyperlipidemia Heather Lee has an LDL of 110, HDL 69, and triglycerides 59.  Assessment/Plan:   1. Prediabetes Natajah will continue to work on weight loss, exercise, and decreasing simple carbohydrates to help decrease the risk of diabetes. Check labs today.  - Hemoglobin A1c - Insulin, random  Refill- metFORMIN (GLUCOPHAGE) 500 MG tablet; Take 1 tablet (500 mg total) by mouth daily with breakfast.  Dispense: 30 tablet; Refill: 0  2. Vitamin D deficiency Low Vitamin D level contributes to fatigue and are associated with obesity, breast, and colon cancer. Heather Lee agrees to continue to take prescription Vitamin D 50,000 IU every week and will follow-up for routine  testing of Vitamin D, at least 2-3 times per year to avoid over-replacement. Check labs today.  - VITAMIN D 25 Hydroxy (Vit-D Deficiency, Fractures)  3. Essential hypertension Louis is working on healthy weight loss and exercise to improve blood pressure control. We will watch for signs of hypotension as Heather Lee continues her lifestyle modifications. Check labs today.  - Comprehensive metabolic panel  4. Other hyperlipidemia Cardiovascular risk and specific lipid/LDL goals reviewed.  We discussed several lifestyle modifications today and Drishti will continue to work on diet, exercise and weight loss efforts. Orders and follow up as documented in patient record.   Counseling Intensive lifestyle modifications are the first line treatment for this issue. Dietary changes: Increase soluble fiber. Decrease simple carbohydrates. Exercise changes: Moderate to vigorous-intensity aerobic activity 150 minutes per week if tolerated. Lipid-lowering medications: see documented in medical record. Check labs today.  - Lipid Panel With LDL/HDL Ratio  5. Obesity with current BMI of 37.4  Heather Lee is currently in the action stage of change. As such, her goal is to continue with weight loss efforts. Heather Lee has agreed to the Category 2 Plan with lunch and dinner and the Category 3 Plan with breakfast.   Exercise goals: No exercise has been prescribed at this time.  Behavioral modification strategies: increasing lean protein intake, meal planning and cooking strategies, keeping healthy foods in the home, and avoiding temptations.  Heather Lee has agreed to follow-up with our clinic in 2-3 weeks. Heather Lee was informed of the importance of frequent follow-up visits to maximize her success with intensive lifestyle modifications for  her multiple health conditions.   Heather Lee was informed we would discuss her lab results at her next visit unless there is a critical issue that needs to be addressed sooner.  Heather Lee agreed to keep her next visit at the agreed upon time to discuss these results.  Objective:   Blood pressure 116/79, pulse 86, temperature 97.7 F (36.5 C), height 5\' 7"  (1.702 m), weight 238 lb (108 kg), last menstrual period 06/07/2018, SpO2 95 %. Body mass index is 37.28 kg/m.  General: Cooperative, alert, well developed, in no acute distress. HEENT: Conjunctivae and lids unremarkable. Cardiovascular: Regular rhythm.  Lungs: Normal work of breathing. Neurologic: No focal deficits.   Lab Results  Component Value Date   CREATININE 0.78 01/02/2022   BUN 8 01/02/2022   NA 140 01/02/2022   K 4.2 01/02/2022   CL 102 01/02/2022   CO2 26 01/02/2022   Lab Results  Component Value Date   ALT 21 01/02/2022   AST 22 01/02/2022   ALKPHOS 71 01/02/2022   BILITOT 0.5 01/02/2022   Lab Results  Component Value Date   HGBA1C 5.5 01/02/2022   HGBA1C 5.7 (H) 07/21/2021   HGBA1C 5.7 (H) 12/28/2020   Lab Results  Component Value Date   INSULIN 36.4 (H) 01/02/2022   INSULIN 32.9 (H) 07/21/2021   INSULIN 30.4 (H) 12/28/2020   Lab Results  Component Value Date   TSH 1.770 12/28/2020   Lab Results  Component Value Date   CHOL 175 01/02/2022   HDL 60 01/02/2022   LDLCALC 104 (H) 01/02/2022   TRIG 59 01/02/2022   Lab Results  Component Value Date   VD25OH 53.3 01/02/2022   VD25OH 43.4 07/21/2021   VD25OH 47.0 05/16/2021   Lab Results  Component Value Date   WBC 5.2 12/28/2020   HGB 14.2 12/28/2020   HCT 43.2 12/28/2020   MCV 82 12/28/2020   PLT 375 12/28/2020    Attestation Statements:   Reviewed by clinician on day of visit: allergies, medications, problem list, medical history, surgical history, family history, social history, and previous encounter notes.  02/25/2021, CMA, am acting as transcriptionist for Edmund Hilda, MD.   I have reviewed the above documentation for accuracy and completeness, and I agree with the above. - Reuben Likes, MD

## 2022-01-10 ENCOUNTER — Other Ambulatory Visit (INDEPENDENT_AMBULATORY_CARE_PROVIDER_SITE_OTHER): Payer: Self-pay | Admitting: Adult Health

## 2022-01-10 DIAGNOSIS — E559 Vitamin D deficiency, unspecified: Secondary | ICD-10-CM

## 2022-01-16 ENCOUNTER — Other Ambulatory Visit: Payer: Self-pay

## 2022-01-16 ENCOUNTER — Encounter (INDEPENDENT_AMBULATORY_CARE_PROVIDER_SITE_OTHER): Payer: Self-pay | Admitting: Family Medicine

## 2022-01-16 ENCOUNTER — Ambulatory Visit (INDEPENDENT_AMBULATORY_CARE_PROVIDER_SITE_OTHER): Payer: BC Managed Care – PPO | Admitting: Family Medicine

## 2022-01-16 VITALS — BP 120/82 | HR 95 | Temp 98.5°F | Ht 67.0 in | Wt 228.0 lb

## 2022-01-16 DIAGNOSIS — E559 Vitamin D deficiency, unspecified: Secondary | ICD-10-CM | POA: Diagnosis not present

## 2022-01-16 DIAGNOSIS — E669 Obesity, unspecified: Secondary | ICD-10-CM

## 2022-01-16 DIAGNOSIS — E7849 Other hyperlipidemia: Secondary | ICD-10-CM | POA: Diagnosis not present

## 2022-01-16 DIAGNOSIS — I10 Essential (primary) hypertension: Secondary | ICD-10-CM | POA: Diagnosis not present

## 2022-01-16 DIAGNOSIS — R7303 Prediabetes: Secondary | ICD-10-CM

## 2022-01-16 DIAGNOSIS — Z6835 Body mass index (BMI) 35.0-35.9, adult: Secondary | ICD-10-CM

## 2022-01-16 DIAGNOSIS — Z6836 Body mass index (BMI) 36.0-36.9, adult: Secondary | ICD-10-CM

## 2022-01-16 MED ORDER — METFORMIN HCL 500 MG PO TABS: 500.0000 mg | ORAL_TABLET | Freq: Every day | ORAL | 0 refills | Status: DC

## 2022-01-16 NOTE — Progress Notes (Signed)
Chief Complaint:   OBESITY Heather Lee is here to discuss her progress with her obesity treatment plan along with follow-up of her obesity related diagnoses. Heather Lee is on the Category 2 Plan and the Category 3 Plan and states she is following her eating plan approximately ?% of the time. Heather Lee states she is biking and walking 30 minutes 6 times per week.  Today's visit was #: 11 Starting weight: 232 lbs Starting date: 12/28/2020 Today's weight: 228 lbs Today's date: 01/16/2022 Total lbs lost to date: 4 Total lbs lost since last in-office visit: 10  Interim History: Pt feels very proud of her adherence to meal plan over the last few weeks. She did have 2 episodes of vomiting over the last 2 weeks. Pt realizes she does better sticking to a pattern. She had some GI uneasiness with oatmeal. Pt is traveling to [place] for a week for her birthday.  Subjective:   1. Vitamin D deficiency Discussed labs with patient today. Pt's Vit D level is 53.3. She reports fatigue.  2. Prediabetes Discussed labs with patient today. Pt's last A1c was 5.5 with an insulin level of 36.4. She is on Metformin with minimal GI side effects.  3. Other hyperlipidemia Discussed labs with patient today. Pt's LDL was previously 110 and is not 104. She is not on statin meds.  4. Essential hypertension BP well controlled. Pt denies chest pain/chest pressure/headache. She is on Benicar 20 mg.  Assessment/Plan:   1. Vitamin D deficiency Low Vitamin D level contributes to fatigue and are associated with obesity, breast, and colon cancer. She agrees to change prescription Vitamin D to OTC Vit D 5K IU daily and will follow-up for routine testing of Vitamin D, at least 2-3 times per year to avoid over-replacement.  2. Prediabetes Heather Lee will continue to work on weight loss, exercise, and decreasing simple carbohydrates to help decrease the risk of diabetes. Repeat labs in 4 months- anticipate a  decline in insulin.  Refill- metFORMIN (GLUCOPHAGE) 500 MG tablet; Take 1 tablet (500 mg total) by mouth daily with breakfast.  Dispense: 30 tablet; Refill: 0  3. Other hyperlipidemia Cardiovascular risk and specific lipid/LDL goals reviewed.  We discussed several lifestyle modifications today and Heather Lee will continue to work on diet, exercise and weight loss efforts. Orders and follow up as documented in patient record. Continue category 2 & 3. Repeat labs in 4 months. Not at goal yet.  4. Essential hypertension Heather Lee is working on healthy weight loss and exercise to improve blood pressure control. We will watch for signs of hypotension as she continues her lifestyle modifications. F/u BP at next appt. If BP continue to stay controlled, we will decrease meds.  5. Obesity with current BMI of 35.7  Heather Lee is currently in the action stage of change. As such, her goal is to continue with weight loss efforts. She has agreed to the Category 2 Plan and the Category 3 Plan.   Exercise goals: All adults should avoid inactivity. Some physical activity is better than none, and adults who participate in any amount of physical activity gain some health benefits. Pt is to continue waling 30 minutes 5-6 times a week.  Behavioral modification strategies: increasing lean protein intake, meal planning and cooking strategies, keeping healthy foods in the home, and planning for success.  Heather Lee has agreed to follow-up with our clinic in 2 weeks. She was informed of the importance of frequent follow-up visits to maximize her success with intensive lifestyle modifications  for her multiple health conditions.   Objective:   Blood pressure 120/82, pulse 95, temperature 98.5 F (36.9 C), height 5\' 7"  (1.702 m), weight 228 lb (103.4 kg), last menstrual period 06/07/2018, SpO2 97 %. Body mass index is 35.71 kg/m.  General: Cooperative, alert, well developed, in no acute distress. HEENT:  Conjunctivae and lids unremarkable. Cardiovascular: Regular rhythm.  Lungs: Normal work of breathing. Neurologic: No focal deficits.   Lab Results  Component Value Date   CREATININE 0.78 01/02/2022   BUN 8 01/02/2022   NA 140 01/02/2022   K 4.2 01/02/2022   CL 102 01/02/2022   CO2 26 01/02/2022   Lab Results  Component Value Date   ALT 21 01/02/2022   AST 22 01/02/2022   ALKPHOS 71 01/02/2022   BILITOT 0.5 01/02/2022   Lab Results  Component Value Date   HGBA1C 5.5 01/02/2022   HGBA1C 5.7 (H) 07/21/2021   HGBA1C 5.7 (H) 12/28/2020   Lab Results  Component Value Date   INSULIN 36.4 (H) 01/02/2022   INSULIN 32.9 (H) 07/21/2021   INSULIN 30.4 (H) 12/28/2020   Lab Results  Component Value Date   TSH 1.770 12/28/2020   Lab Results  Component Value Date   CHOL 175 01/02/2022   HDL 60 01/02/2022   LDLCALC 104 (H) 01/02/2022   TRIG 59 01/02/2022   Lab Results  Component Value Date   VD25OH 53.3 01/02/2022   VD25OH 43.4 07/21/2021   VD25OH 47.0 05/16/2021   Lab Results  Component Value Date   WBC 5.2 12/28/2020   HGB 14.2 12/28/2020   HCT 43.2 12/28/2020   MCV 82 12/28/2020   PLT 375 12/28/2020    Attestation Statements:   Reviewed by clinician on day of visit: allergies, medications, problem list, medical history, surgical history, family history, social history, and previous encounter notes.  02/25/2021, CMA, am acting as transcriptionist for Edmund Hilda, MD.  I have reviewed the above documentation for accuracy and completeness, and I agree with the above. - Reuben Likes, MD

## 2022-01-27 ENCOUNTER — Other Ambulatory Visit (INDEPENDENT_AMBULATORY_CARE_PROVIDER_SITE_OTHER): Payer: Self-pay | Admitting: Family Medicine

## 2022-01-27 DIAGNOSIS — R7303 Prediabetes: Secondary | ICD-10-CM

## 2022-01-30 ENCOUNTER — Encounter (INDEPENDENT_AMBULATORY_CARE_PROVIDER_SITE_OTHER): Payer: Self-pay | Admitting: Family Medicine

## 2022-01-30 ENCOUNTER — Other Ambulatory Visit: Payer: Self-pay

## 2022-01-30 ENCOUNTER — Ambulatory Visit (INDEPENDENT_AMBULATORY_CARE_PROVIDER_SITE_OTHER): Payer: BC Managed Care – PPO | Admitting: Family Medicine

## 2022-01-30 VITALS — BP 120/80 | HR 96 | Temp 98.0°F | Ht 67.0 in | Wt 228.0 lb

## 2022-01-30 DIAGNOSIS — Z6835 Body mass index (BMI) 35.0-35.9, adult: Secondary | ICD-10-CM

## 2022-01-30 DIAGNOSIS — E559 Vitamin D deficiency, unspecified: Secondary | ICD-10-CM | POA: Diagnosis not present

## 2022-01-30 DIAGNOSIS — Z6836 Body mass index (BMI) 36.0-36.9, adult: Secondary | ICD-10-CM

## 2022-01-30 DIAGNOSIS — E669 Obesity, unspecified: Secondary | ICD-10-CM | POA: Diagnosis not present

## 2022-01-30 DIAGNOSIS — R7303 Prediabetes: Secondary | ICD-10-CM | POA: Diagnosis not present

## 2022-01-30 MED ORDER — VITAMIN D 125 MCG (5000 UT) PO CAPS: 1.0000 | ORAL_CAPSULE | Freq: Every day | ORAL | 0 refills | Status: DC

## 2022-01-30 MED ORDER — METFORMIN HCL 500 MG PO TABS: 500.0000 mg | ORAL_TABLET | Freq: Every day | ORAL | 0 refills | Status: DC

## 2022-01-30 NOTE — Progress Notes (Signed)
Chief Complaint:   OBESITY Heather Lee is here to discuss her progress with her obesity treatment plan along with follow-up of her obesity related diagnoses. Heather Lee is on the Category 2 Plan and the Category 3 Plan and states she is following her eating plan approximately 95% of the time. Heather Lee states she is walking and using the stationary bike for 30 minutes 3 times per week.  Today's visit was #: 12 Starting weight: 232 lbs Starting date: 12/28/2020 Today's weight: 228 lbs Today's date: 01/30/2022 Total lbs lost to date: 4 lbs Total lbs lost since last in-office visit: 0  Interim History: Heather Lee is bored with food options at dinner. She is not eating all of the food on the plan. As we went through the meal plan she showed me several instances where she was not eating the listed foods.  Subjective:   1. Pre-diabetes Deara's last A1C was 5.5 (previously 5.7). She is on Metformin. She denies polyphagia.   Lab Results  Component Value Date   HGBA1C 5.5 01/02/2022   Lab Results  Component Value Date   INSULIN 36.4 (H) 01/02/2022   INSULIN 32.9 (H) 07/21/2021   INSULIN 30.4 (H) 12/28/2020    2. Vitamin D deficiency Heather Lee is taking 2000 IU of over the counter daily. Her Vitamin D is at goal (53.3).  Lab Results  Component Value Date   VD25OH 53.3 01/02/2022   VD25OH 43.4 07/21/2021   VD25OH 47.0 05/16/2021    Assessment/Plan:   1. Pre-diabetes We will refill Metformin 500 mg every morning.   - metFORMIN (GLUCOPHAGE) 500 MG tablet; Take 1 tablet (500 mg total) by mouth daily with breakfast.  Dispense: 30 tablet; Refill: 0  2. Vitamin D deficiency Salimah agrees to increase dose of Vitamin D to 5,000 IU daily and will she follow-up for routine testing of Vitamin D, at least 2-3 times per year to avoid over-replacement.  - Cholecalciferol (VITAMIN D) 125 MCG (5000 UT) CAPS; Take 1 capsule by mouth daily.  Dispense: 90 capsule; Refill: 0  3.  Obesity with current BMI of 35.7 Heather Lee is currently in the action stage of change. As such, her goal is to continue with weight loss efforts. She has agreed to the Category 2 Plan and keeping a food journal and adhering to recommended goals of 400-500 calories and 35 plus grams of  protein.   Handouts: Journaling, protein equivalents, recipes.  Exercise goals:  As is.  Behavioral modification strategies: increasing lean protein intake and meal planning and cooking strategies.  Heather Lee has agreed to follow-up with our clinic in 2 weeks with Alois Cliche PA-C.  Objective:   Blood pressure 120/80, pulse 96, temperature 98 F (36.7 C), height 5\' 7"  (1.702 m), weight 228 lb (103.4 kg), last menstrual period 06/07/2018, SpO2 98 %. Body mass index is 35.71 kg/m.  General: Cooperative, alert, well developed, in no acute distress. HEENT: Conjunctivae and lids unremarkable. Cardiovascular: Regular rhythm.  Lungs: Normal work of breathing. Neurologic: No focal deficits.   Lab Results  Component Value Date   CREATININE 0.78 01/02/2022   BUN 8 01/02/2022   NA 140 01/02/2022   K 4.2 01/02/2022   CL 102 01/02/2022   CO2 26 01/02/2022   Lab Results  Component Value Date   ALT 21 01/02/2022   AST 22 01/02/2022   ALKPHOS 71 01/02/2022   BILITOT 0.5 01/02/2022   Lab Results  Component Value Date   HGBA1C 5.5 01/02/2022   HGBA1C 5.7 (H)  07/21/2021   HGBA1C 5.7 (H) 12/28/2020   Lab Results  Component Value Date   INSULIN 36.4 (H) 01/02/2022   INSULIN 32.9 (H) 07/21/2021   INSULIN 30.4 (H) 12/28/2020   Lab Results  Component Value Date   TSH 1.770 12/28/2020   Lab Results  Component Value Date   CHOL 175 01/02/2022   HDL 60 01/02/2022   LDLCALC 104 (H) 01/02/2022   TRIG 59 01/02/2022   Lab Results  Component Value Date   VD25OH 53.3 01/02/2022   VD25OH 43.4 07/21/2021   VD25OH 47.0 05/16/2021   Lab Results  Component Value Date   WBC 5.2 12/28/2020   HGB  14.2 12/28/2020   HCT 43.2 12/28/2020   MCV 82 12/28/2020   PLT 375 12/28/2020   No results found for: IRON, TIBC, FERRITIN  Attestation Statements:   Reviewed by clinician on day of visit: allergies, medications, problem list, medical history, surgical history, family history, social history, and previous encounter notes.  I, Jackson Latino, RMA, am acting as Energy manager for [place], FNP.  I have reviewed the above documentation for accuracy and completeness, and I agree with the above. -  Jesse Sans, FNP

## 2022-01-31 ENCOUNTER — Encounter (INDEPENDENT_AMBULATORY_CARE_PROVIDER_SITE_OTHER): Payer: Self-pay | Admitting: Family Medicine

## 2022-02-14 ENCOUNTER — Ambulatory Visit (INDEPENDENT_AMBULATORY_CARE_PROVIDER_SITE_OTHER): Payer: BC Managed Care – PPO | Admitting: Bariatrics

## 2022-02-21 ENCOUNTER — Encounter (INDEPENDENT_AMBULATORY_CARE_PROVIDER_SITE_OTHER): Payer: Self-pay | Admitting: Bariatrics

## 2022-02-21 ENCOUNTER — Ambulatory Visit (INDEPENDENT_AMBULATORY_CARE_PROVIDER_SITE_OTHER): Payer: BC Managed Care – PPO | Admitting: Bariatrics

## 2022-02-21 ENCOUNTER — Other Ambulatory Visit: Payer: Self-pay

## 2022-02-21 VITALS — BP 121/82 | HR 83 | Temp 98.2°F | Ht 67.0 in | Wt 232.0 lb

## 2022-02-21 DIAGNOSIS — Z6836 Body mass index (BMI) 36.0-36.9, adult: Secondary | ICD-10-CM | POA: Diagnosis not present

## 2022-02-21 DIAGNOSIS — E669 Obesity, unspecified: Secondary | ICD-10-CM | POA: Diagnosis not present

## 2022-02-21 DIAGNOSIS — E559 Vitamin D deficiency, unspecified: Secondary | ICD-10-CM

## 2022-02-21 DIAGNOSIS — I10 Essential (primary) hypertension: Secondary | ICD-10-CM

## 2022-02-21 MED ORDER — VITAMIN D 125 MCG (5000 UT) PO CAPS: 1.0000 | ORAL_CAPSULE | Freq: Every day | ORAL | 0 refills | Status: DC

## 2022-02-21 NOTE — Progress Notes (Signed)
Chief Complaint:   OBESITY Heather Lee is here to discuss her progress with her obesity treatment plan along with follow-up of her obesity related diagnoses. Heather Lee is on the Category 2 Plan and states she is following her eating plan approximately 95% of the time. Heather Lee states she is walking for 30 minutes 2-3 times per week.  Today's visit was #: 13 Starting weight: 232 lbs Starting date: 12/28/2020 Today's weight: 232 lbs Today's date: 02/21/2022 Total lbs lost to date: 0 Total lbs lost since last in-office visit: 0  Interim History: Heather Lee is up 4 lbs since her last visit. She states that she is tracking her food.  Subjective:   1. Vitamin D deficiency Heather Lee is taking Vitamin D currently.  2. Essential hypertension Heather Lee is currently taking Benicar. Her blood pressure is controlled. Her last blood pressure was 120/80.  Assessment/Plan:   1. Vitamin D deficiency Low Vitamin D level contributes to fatigue and are associated with obesity, breast, and colon cancer. We will refill prescription Vitamin D 50,000 IU every week for 3 months with no refills and Gema will follow-up for routine testing of Vitamin D, at least 2-3 times per year to avoid over-replacement.  - Cholecalciferol (VITAMIN D) 125 MCG (5000 UT) CAPS; Take 1 capsule by mouth daily.  Dispense: 90 capsule; Refill: 0  2. Essential hypertension Nezzie will continue taking Benicar. She is working on healthy weight loss and exercise to improve blood pressure control. We will watch for signs of hypotension as she continues her lifestyle modifications.  3. Obesity with current BMI of 36.3 Heather Lee is currently in the action stage of change. As such, her goal is to continue with weight loss efforts. She has agreed to the Category 2 Plan and keeping a food journal and adhering to recommended goals of 1200 calories and 80 grams of protein.   Heather Lee will continue meal planning and she  will continue intentional eating. She will start to track her food.   Exercise goals:  Heather Lee is walking at the [facility].   Behavioral modification strategies: increasing lean protein intake, decreasing simple carbohydrates, increasing vegetables, increasing water intake, decreasing eating out, no skipping meals, meal planning and cooking strategies, keeping healthy foods in the home, and planning for success.  Heather Lee has agreed to follow-up with our clinic in 2 weeks with Dr. Lawson Radar or William Hamburger, NP or Adah Salvage, FNP or Alois Cliche PA-C. She was informed of the importance of frequent follow-up visits to maximize her success with intensive lifestyle modifications for her multiple health conditions.   Objective:   Blood pressure 121/82, pulse 83, temperature 98.2 F (36.8 C), height 5\' 7"  (1.702 m), weight 232 lb (105.2 kg), last menstrual period 06/07/2018, SpO2 97 %. Body mass index is 36.34 kg/m.  General: Cooperative, alert, well developed, in no acute distress. HEENT: Conjunctivae and lids unremarkable. Cardiovascular: Regular rhythm.  Lungs: Normal work of breathing. Neurologic: No focal deficits.   Lab Results  Component Value Date   CREATININE 0.78 01/02/2022   BUN 8 01/02/2022   NA 140 01/02/2022   K 4.2 01/02/2022   CL 102 01/02/2022   CO2 26 01/02/2022   Lab Results  Component Value Date   ALT 21 01/02/2022   AST 22 01/02/2022   ALKPHOS 71 01/02/2022   BILITOT 0.5 01/02/2022   Lab Results  Component Value Date   HGBA1C 5.5 01/02/2022   HGBA1C 5.7 (H) 07/21/2021   HGBA1C 5.7 (H) 12/28/2020   Lab Results  Component Value Date   INSULIN 36.4 (H) 01/02/2022   INSULIN 32.9 (H) 07/21/2021   INSULIN 30.4 (H) 12/28/2020   Lab Results  Component Value Date   TSH 1.770 12/28/2020   Lab Results  Component Value Date   CHOL 175 01/02/2022   HDL 60 01/02/2022   LDLCALC 104 (H) 01/02/2022   TRIG 59 01/02/2022   Lab Results  Component Value Date    VD25OH 53.3 01/02/2022   VD25OH 43.4 07/21/2021   VD25OH 47.0 05/16/2021   Lab Results  Component Value Date   WBC 5.2 12/28/2020   HGB 14.2 12/28/2020   HCT 43.2 12/28/2020   MCV 82 12/28/2020   PLT 375 12/28/2020   No results found for: IRON, TIBC, FERRITIN  Attestation Statements:   Reviewed by clinician on day of visit: allergies, medications, problem list, medical history, surgical history, family history, social history, and previous encounter notes.  I, Jackson Latino, RMA, am acting as Energy manager for [employer], DO.  I have reviewed the above documentation for accuracy and completeness, and I agree with the above. Corinna Capra, DO

## 2022-02-23 ENCOUNTER — Other Ambulatory Visit (INDEPENDENT_AMBULATORY_CARE_PROVIDER_SITE_OTHER): Payer: Self-pay | Admitting: Family Medicine

## 2022-02-23 DIAGNOSIS — R7303 Prediabetes: Secondary | ICD-10-CM

## 2022-02-27 ENCOUNTER — Other Ambulatory Visit (INDEPENDENT_AMBULATORY_CARE_PROVIDER_SITE_OTHER): Payer: Self-pay | Admitting: Family Medicine

## 2022-02-27 DIAGNOSIS — R7303 Prediabetes: Secondary | ICD-10-CM

## 2022-02-27 NOTE — Telephone Encounter (Signed)
Dr.Brown 

## 2022-03-01 IMAGING — MG DIGITAL DIAGNOSTIC BILAT W/ TOMO W/ CAD
6 of 10 series · 6 of 30 positions shown · non-contrast
Comparison: Previous exam(s).

CLINICAL DATA: 49-year-old female with a waxing and waning skin
lump in the medial left breast. Patient has had a prior surgical
removal of a sebaceous cyst in this area.

EXAM:
DIGITAL DIAGNOSTIC BILATERAL MAMMOGRAM WITH CAD AND TOMO
ULTRASOUND LEFT BREAST

[R CC synth-2D]
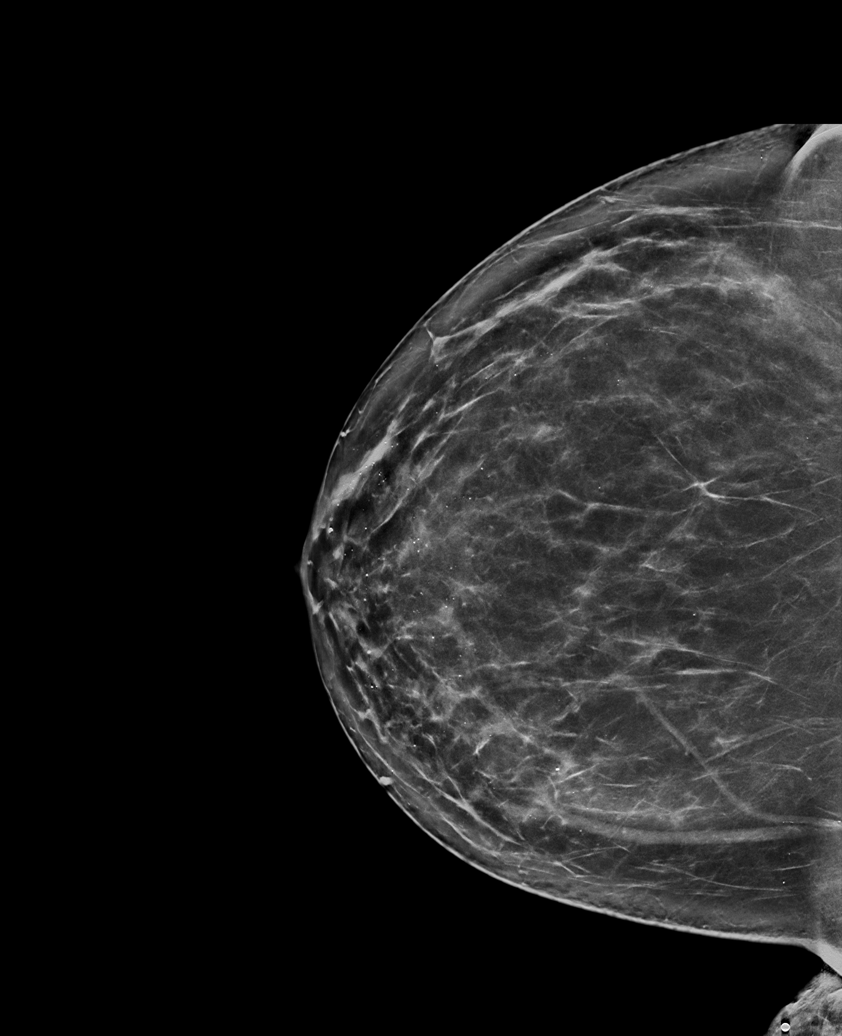

[L CC synth-2D (1 of 2)]
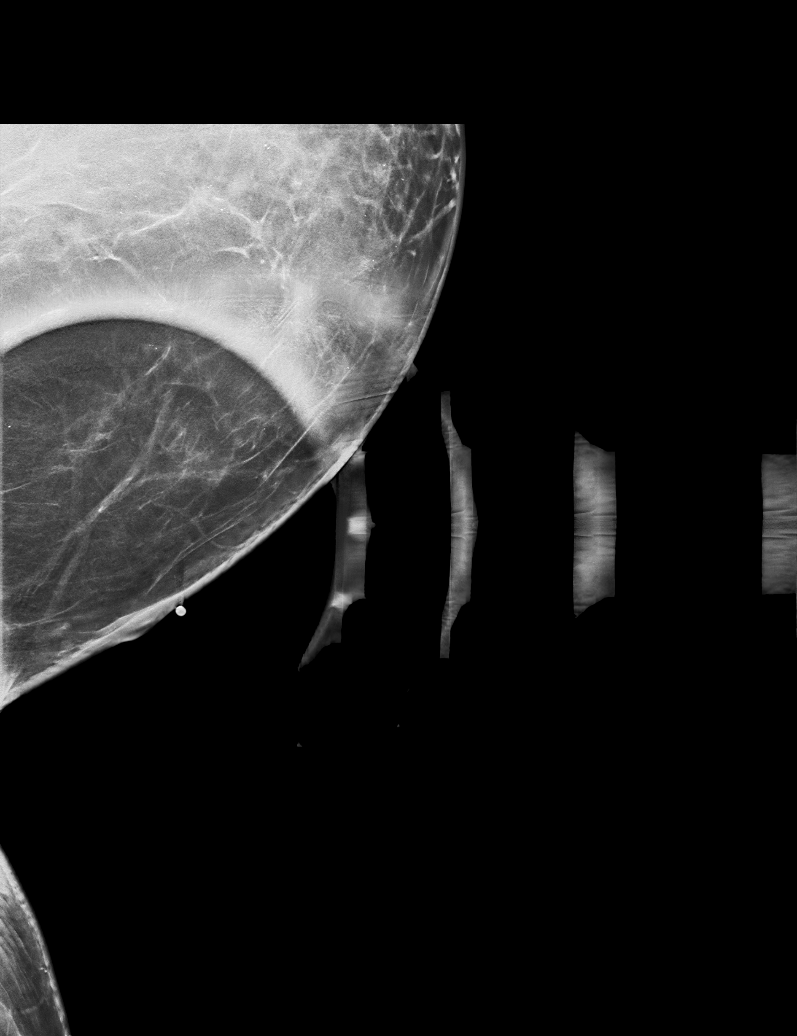

[L MLO synth-2D]
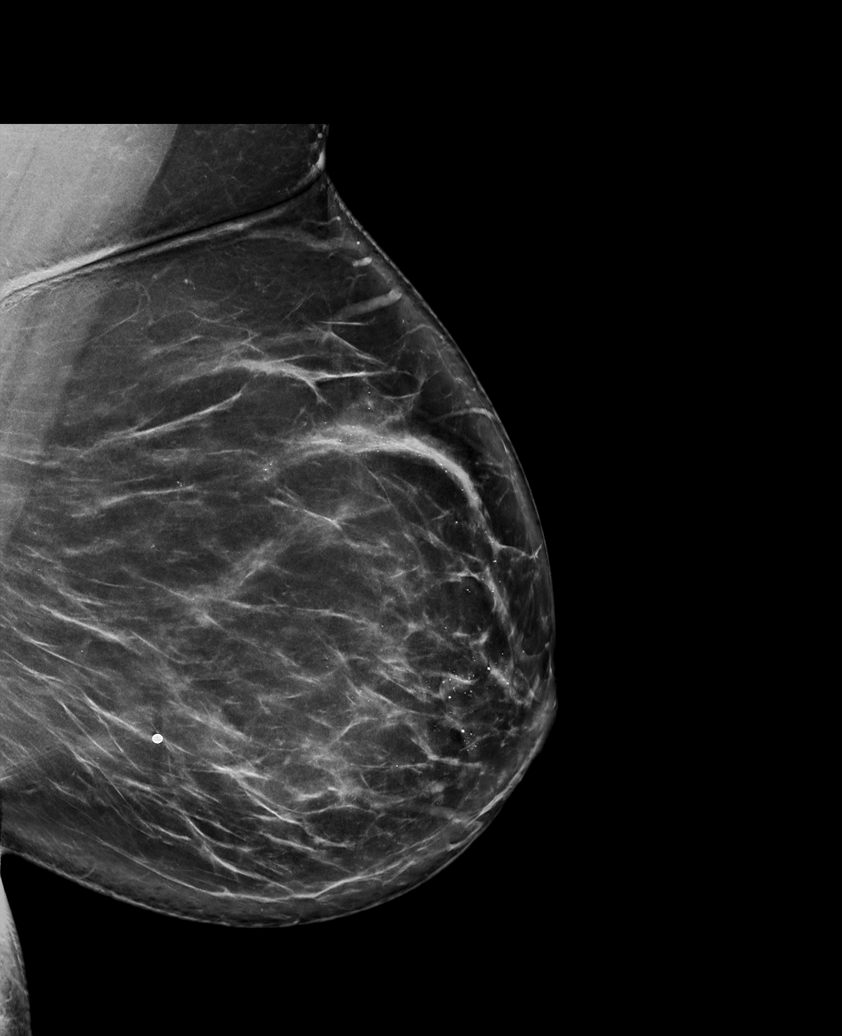

[R MLO synth-2D]
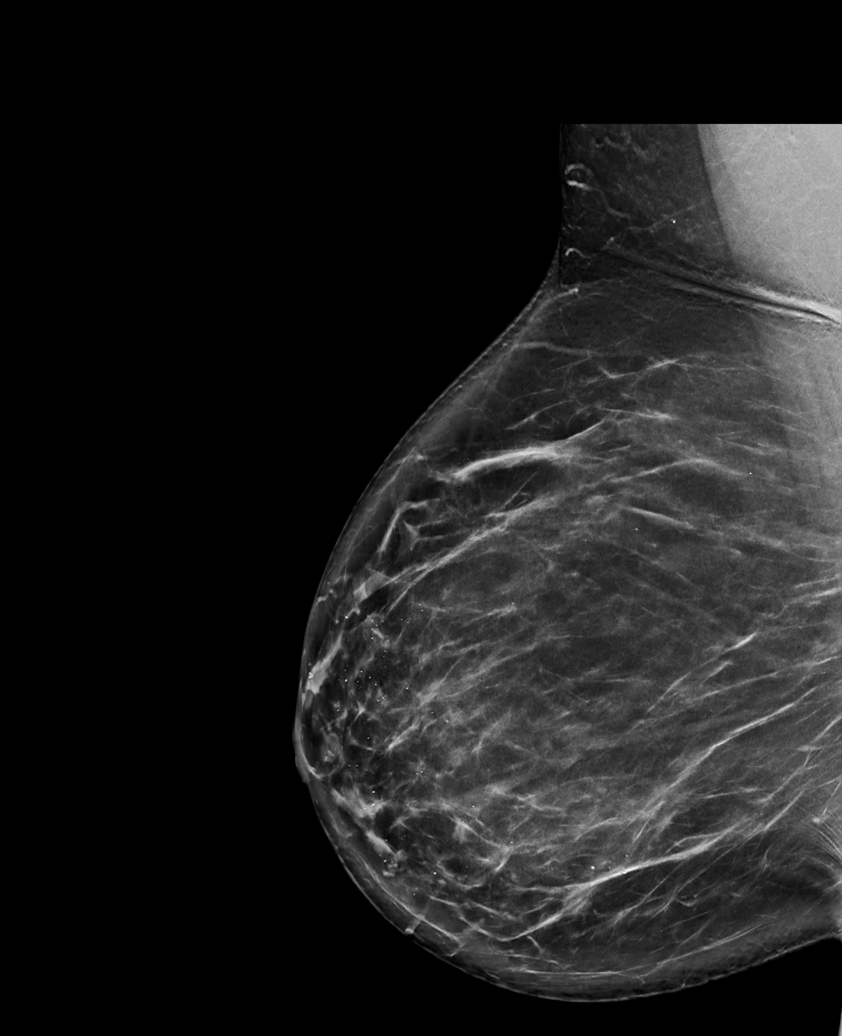

[L CC synth-2D (2 of 2)]
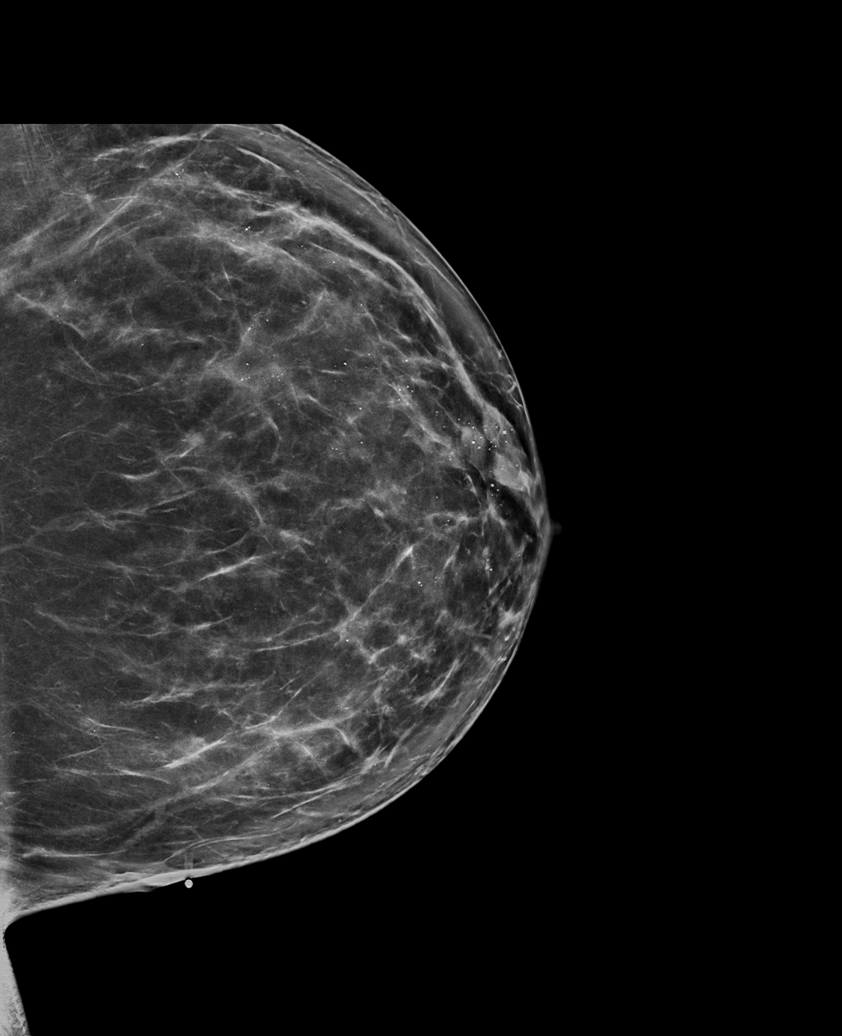

[R MLO tomo · tomo slice 53/104.0]
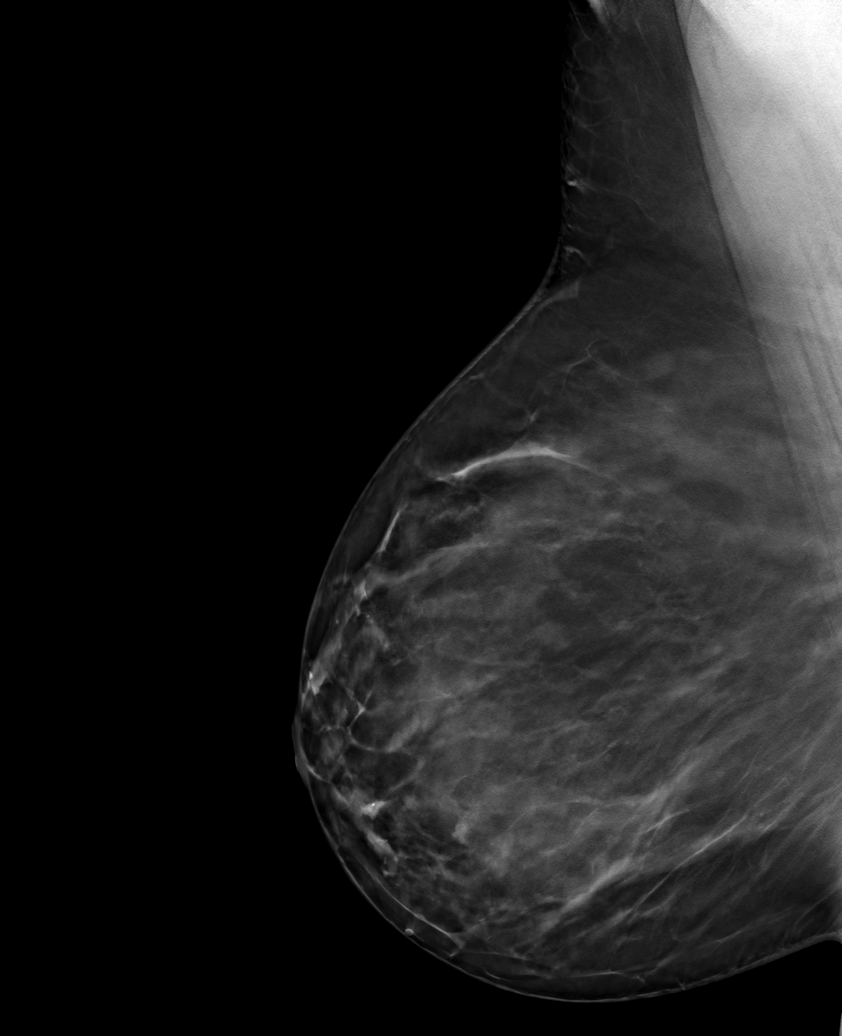

[6 of 30 positions shown; findings below may reference images not displayed]

ACR Breast Density Category b: There are scattered areas of
fibroglandular density.
FINDINGS: Radiopaque BB is placed at the site of the patient's palpable lump
in the far medial left breast. A focal area of skin thickening is
seen deep to the radiopaque BB. No additional suspicious findings
are identified.

No suspicious calcifications, masses or distortions are identified
in the remainder of either breast.

Mammographic images were processed with CAD.

On physical exam, a 2-3 mm lump is seen protruding from the skin
adjacent to a scar along the medial left breast.

Targeted ultrasound is performed, showing a heterogenous hypoechoic
mass located entirely within the skin at the [DATE] position 13 cm
from the nipple. Overall it measures 1.6 x 2.0 x 0.3 cm. A slightly
more hypoechoic area measuring 5 x 5 x 3 mm is seen centrally. No
other suspicious sonographic findings are identified.
IMPRESSION: 1. Benign skin lesion in the medial left breast likely representing
a sebaceous cyst. Recommendation is for clinical and symptomatic
follow-up.
2. No mammographic evidence of malignancy in either breast.

RECOMMENDATION:
1. Clinical and symptomatic follow-up for the patient's left breast
skin lesion. The patient states she would like to pursue surgical
removal.
2.  Screening mammogram in one year.(Code:ZV-[id number])

I have discussed the findings and recommendations with the patient.
If applicable, a reminder letter will be sent to the patient
regarding the next appointment.

BI-RADS CATEGORY  2: Benign.

## 2022-03-07 ENCOUNTER — Other Ambulatory Visit: Payer: Self-pay | Admitting: Internal Medicine

## 2022-03-07 ENCOUNTER — Other Ambulatory Visit: Payer: Self-pay

## 2022-03-07 ENCOUNTER — Ambulatory Visit
Admission: RE | Admit: 2022-03-07 | Discharge: 2022-03-07 | Disposition: A | Discharge status: Home / Self Care | Payer: BC Managed Care – PPO | Source: Ambulatory Visit | Attending: Internal Medicine | Admitting: Internal Medicine

## 2022-03-07 DIAGNOSIS — M25511 Pain in right shoulder: Secondary | ICD-10-CM

## 2022-03-08 ENCOUNTER — Ambulatory Visit: Payer: BC Managed Care – PPO | Admitting: Podiatry

## 2022-03-15 ENCOUNTER — Ambulatory Visit (INDEPENDENT_AMBULATORY_CARE_PROVIDER_SITE_OTHER): Payer: BC Managed Care – PPO | Admitting: Physician Assistant

## 2022-03-15 ENCOUNTER — Other Ambulatory Visit: Payer: Self-pay

## 2022-03-15 ENCOUNTER — Encounter (INDEPENDENT_AMBULATORY_CARE_PROVIDER_SITE_OTHER): Payer: Self-pay | Admitting: Physician Assistant

## 2022-03-15 VITALS — BP 127/89 | HR 89 | Temp 98.0°F | Ht 67.0 in | Wt 229.0 lb

## 2022-03-15 DIAGNOSIS — Z9189 Other specified personal risk factors, not elsewhere classified: Secondary | ICD-10-CM

## 2022-03-15 DIAGNOSIS — R7303 Prediabetes: Secondary | ICD-10-CM

## 2022-03-15 DIAGNOSIS — Z6835 Body mass index (BMI) 35.0-35.9, adult: Secondary | ICD-10-CM | POA: Diagnosis not present

## 2022-03-15 DIAGNOSIS — E669 Obesity, unspecified: Secondary | ICD-10-CM

## 2022-03-15 MED ORDER — METFORMIN HCL 500 MG PO TABS: 500.0000 mg | ORAL_TABLET | Freq: Every day | ORAL | 0 refills | Status: DC

## 2022-03-18 NOTE — Progress Notes (Signed)
? ? ? ?Chief Complaint:  ? ?OBESITY ?Heather Lee is here to discuss her progress with her obesity treatment plan along with follow-up of her obesity related diagnoses. Heather Lee is on the Category 2 Plan and keeping a food journal and adhering to recommended goals of 1200 calories and 80 grams of protein and states she is following her eating plan approximately 90% of the time. Heather Lee states she is doing 0 minutes 0 times per week. ? ?Today's visit was #: 14 ?Starting weight: 232 lbs ?Starting date: 12/28/2020 ?Today's weight: 229 lbs ?Today's date: 03/15/2022 ?Total lbs lost to date: 3 lbs ?Total lbs lost since last in-office visit: 3 lbs ? ?Interim History: Heather Lee is journaling, although inconsistently. She does feel that journaling helps her be more accountable.Her GYN put her on Rybelsus but she is not yet taking it. She is unsure of the dose. ? ?Subjective:  ? ?1. Pre-diabetes ?Heather Lee is currently on Metformin once daily. Her last A1C was 5.5. She reports that GYN gave her Rybelsus 3 mg but she hasn't started it yet.  ? ?2. At risk for diabetes mellitus ?Heather Lee is at higher than average risk for developing diabetes due to her obesity.  ? ?Assessment/Plan:  ? ?1. Pre-diabetes ?We will refill Metformin 500 mg for 1 month with no refills. Heather Lee will continue to work on weight loss, exercise, and decreasing simple carbohydrates to help decrease the risk of diabetes.  ? ?- metFORMIN (GLUCOPHAGE) 500 MG tablet; Take 1 tablet (500 mg total) by mouth daily with breakfast.  Dispense: 30 tablet; Refill: 0 ? ?2. At risk for diabetes mellitus ?Heather Lee was given approximately 15 minutes of diabetic education and counseling today. We discussed intensive lifestyle modifications today with an emphasis on weight loss as well as increasing exercise and decreasing simple carbohydrates in her diet. We also reviewed medication options with an emphasis on risk versus benefits of those discussed. ? ?Repetitive  spaced learning was employed today to elicit superior memory formation and behavioral change.  ? ?3. Obesity with current BMI of 35.86 ?Heather Lee is currently in the action stage of change. As such, her goal is to continue with weight loss efforts. She has agreed to keeping a food journal and adhering to recommended goals of 1200 calories and 80 grams of protein daily.  ? ?Exercise goals: No exercise has been prescribed at this time. ? ?Behavioral modification strategies: meal planning and cooking strategies and keeping a strict food journal. ? ?Shaneal has agreed to follow-up with our clinic in 2 weeks. She was informed of the importance of frequent follow-up visits to maximize her success with intensive lifestyle modifications for her multiple health conditions.  ? ?Objective:  ? ?Blood pressure 127/89, pulse 89, temperature 98 ?F (36.7 ?C), height 5\' 7"  (1.702 m), weight 229 lb (103.9 kg), last menstrual period 06/07/2018, SpO2 99 %. ?Body mass index is 35.87 kg/m?. ? ?General: Cooperative, alert, well developed, in no acute distress. ?HEENT: Conjunctivae and lids unremarkable. ?Cardiovascular: Regular rhythm.  ?Lungs: Normal work of breathing. ?Neurologic: No focal deficits.  ? ?Lab Results  ?Component Value Date  ? CREATININE 0.78 01/02/2022  ? BUN 8 01/02/2022  ? NA 140 01/02/2022  ? K 4.2 01/02/2022  ? CL 102 01/02/2022  ? CO2 26 01/02/2022  ? ?Lab Results  ?Component Value Date  ? ALT 21 01/02/2022  ? AST 22 01/02/2022  ? ALKPHOS 71 01/02/2022  ? BILITOT 0.5 01/02/2022  ? ?Lab Results  ?Component Value Date  ? HGBA1C 5.5 01/02/2022  ?  HGBA1C 5.7 (H) 07/21/2021  ? HGBA1C 5.7 (H) 12/28/2020  ? ?Lab Results  ?Component Value Date  ? INSULIN 36.4 (H) 01/02/2022  ? INSULIN 32.9 (H) 07/21/2021  ? INSULIN 30.4 (H) 12/28/2020  ? ?Lab Results  ?Component Value Date  ? TSH 1.770 12/28/2020  ? ?Lab Results  ?Component Value Date  ? CHOL 175 01/02/2022  ? HDL 60 01/02/2022  ? LDLCALC 104 (H) 01/02/2022  ? TRIG 59  01/02/2022  ? ?Lab Results  ?Component Value Date  ? VD25OH 53.3 01/02/2022  ? VD25OH 43.4 07/21/2021  ? VD25OH 47.0 05/16/2021  ? ?Lab Results  ?Component Value Date  ? WBC 5.2 12/28/2020  ? HGB 14.2 12/28/2020  ? HCT 43.2 12/28/2020  ? MCV 82 12/28/2020  ? PLT 375 12/28/2020  ? ?No results found for: IRON, TIBC, FERRITIN ? ?Attestation Statements:  ? ?Reviewed by clinician on day of visit: allergies, medications, problem list, medical history, surgical history, family history, social history, and previous encounter notes. ? ?I, Tonye Pearson, am acting as Location manager for [employer], PA-C. ? ?I have reviewed the above documentation for accuracy and completeness, and I agree with the above. Abby Potash, PA-C ? ?

## 2022-03-23 ENCOUNTER — Other Ambulatory Visit (INDEPENDENT_AMBULATORY_CARE_PROVIDER_SITE_OTHER): Payer: Self-pay | Admitting: Family Medicine

## 2022-03-23 DIAGNOSIS — R7303 Prediabetes: Secondary | ICD-10-CM

## 2022-04-04 ENCOUNTER — Ambulatory Visit (INDEPENDENT_AMBULATORY_CARE_PROVIDER_SITE_OTHER): Payer: BC Managed Care – PPO | Admitting: Physician Assistant

## 2022-04-04 ENCOUNTER — Encounter (INDEPENDENT_AMBULATORY_CARE_PROVIDER_SITE_OTHER): Payer: Self-pay | Admitting: Physician Assistant

## 2022-04-04 VITALS — BP 107/76 | HR 76 | Temp 97.7°F | Ht 67.0 in | Wt 232.0 lb

## 2022-04-04 DIAGNOSIS — R7303 Prediabetes: Secondary | ICD-10-CM

## 2022-04-04 DIAGNOSIS — Z6835 Body mass index (BMI) 35.0-35.9, adult: Secondary | ICD-10-CM

## 2022-04-04 DIAGNOSIS — E669 Obesity, unspecified: Secondary | ICD-10-CM

## 2022-04-04 DIAGNOSIS — Z9189 Other specified personal risk factors, not elsewhere classified: Secondary | ICD-10-CM

## 2022-04-04 DIAGNOSIS — E66812 Obesity, class 2: Secondary | ICD-10-CM

## 2022-04-04 MED ORDER — METFORMIN HCL 500 MG PO TABS: 500.0000 mg | ORAL_TABLET | Freq: Every day | ORAL | 0 refills | Status: DC

## 2022-04-06 NOTE — Progress Notes (Signed)
? ? ? ?Chief Complaint:  ? ?OBESITY ?Heather Lee is here to discuss her progress with her obesity treatment plan along with follow-up of her obesity related diagnoses. Heather Lee is on the Category 2 Plan and keeping a food journal and adhering to recommended goals of 1200 calories and 80 grams of protein and states she is following her eating plan approximately 50% of the time. Heather Lee states she is walking for 35-40 minutes 4 times per week. ? ?Today's visit was #: 15 ?Starting weight: 232 lbs ?Starting date: 12/28/2020 ?Today's weight: 232 lbs ?Today's date: 04/04/2022 ?Total lbs lost to date: 0 ?Total lbs lost since last in-office visit: 0 ? ?Interim History: Heather Lee splurged during Spring Break with chips and ate out quite a bit. She has not been journaling. She drives back from [place] at night after work and eats late. Her hunger is not an issue.  ? ?Subjective:  ? ?1. Pre-diabetes ?Heather Lee is currently on Metformin. She did not start Rybelsus. His last A1C was 5.5. ? ?2. At risk for side effect of medication ?Heather Lee is at risk for side effect of medication due to starting Rybelsus.  ? ?Assessment/Plan:  ? ?1. Pre-diabetes ?Heather Lee will continue with Metformin and she will start Rybelsus as prescribed. Heather Lee will continue to work on weight loss, exercise, and decreasing simple carbohydrates to help decrease the risk of diabetes.  ? ?- metFORMIN (GLUCOPHAGE) 500 MG tablet; Take 1 tablet (500 mg total) by mouth daily with breakfast.  Dispense: 30 tablet; Refill: 0 ? ?2. At risk for side effect of medication ?Sol was given approximately 15 minutes of drug side effect counseling today.  We discussed side effect possibility and risk versus benefits. Thyra agreed to the medication and will contact this office if these side effects are intolerable. ? ?Repetitive spaced learning was employed today to elicit superior memory formation and behavioral change.  ? ?3. Obesity with current  BMI of 35.86 ?Heather Lee is currently in the action stage of change. As such, her goal is to continue with weight loss efforts. She has agreed to keeping a food journal and adhering to recommended goals of 1200 calories and 80 grams of protein daily.  ? ?Exercise goals:  As is.  ? ?Behavioral modification strategies: meal planning and cooking strategies and keeping healthy foods in the home. ? ?Heather Lee has agreed to follow-up with our clinic in 2-3 weeks. She was informed of the importance of frequent follow-up visits to maximize her success with intensive lifestyle modifications for her multiple health conditions.  ? ?Objective:  ? ?Blood pressure 107/76, pulse 76, temperature 97.7 ?F (36.5 ?C), height 5\' 7"  (1.702 m), weight 232 lb (105.2 kg), last menstrual period 06/07/2018, SpO2 97 %. ?Body mass index is 36.34 kg/m?. ? ?General: Cooperative, alert, well developed, in no acute distress. ?HEENT: Conjunctivae and lids unremarkable. ?Cardiovascular: Regular rhythm.  ?Lungs: Normal work of breathing. ?Neurologic: No focal deficits.  ? ?Lab Results  ?Component Value Date  ? CREATININE 0.78 01/02/2022  ? BUN 8 01/02/2022  ? NA 140 01/02/2022  ? K 4.2 01/02/2022  ? CL 102 01/02/2022  ? CO2 26 01/02/2022  ? ?Lab Results  ?Component Value Date  ? ALT 21 01/02/2022  ? AST 22 01/02/2022  ? ALKPHOS 71 01/02/2022  ? BILITOT 0.5 01/02/2022  ? ?Lab Results  ?Component Value Date  ? HGBA1C 5.5 01/02/2022  ? HGBA1C 5.7 (H) 07/21/2021  ? HGBA1C 5.7 (H) 12/28/2020  ? ?Lab Results  ?Component Value Date  ?  INSULIN 36.4 (H) 01/02/2022  ? INSULIN 32.9 (H) 07/21/2021  ? INSULIN 30.4 (H) 12/28/2020  ? ?Lab Results  ?Component Value Date  ? TSH 1.770 12/28/2020  ? ?Lab Results  ?Component Value Date  ? CHOL 175 01/02/2022  ? HDL 60 01/02/2022  ? LDLCALC 104 (H) 01/02/2022  ? TRIG 59 01/02/2022  ? ?Lab Results  ?Component Value Date  ? VD25OH 53.3 01/02/2022  ? VD25OH 43.4 07/21/2021  ? VD25OH 47.0 05/16/2021  ? ?Lab Results   ?Component Value Date  ? WBC 5.2 12/28/2020  ? HGB 14.2 12/28/2020  ? HCT 43.2 12/28/2020  ? MCV 82 12/28/2020  ? PLT 375 12/28/2020  ? ?No results found for: IRON, TIBC, FERRITIN ? ?Attestation Statements:  ? ?Reviewed by clinician on day of visit: allergies, medications, problem list, medical history, surgical history, family history, social history, and previous encounter notes. ? ?I, Tonye Pearson, am acting as Location manager for [employer], PA-C. ? ?I have reviewed the above documentation for accuracy and completeness, and I agree with the above. Abby Potash, PA-C ? ?

## 2022-04-18 ENCOUNTER — Ambulatory Visit (INDEPENDENT_AMBULATORY_CARE_PROVIDER_SITE_OTHER): Payer: BC Managed Care – PPO | Admitting: Adult Health

## 2022-05-08 ENCOUNTER — Ambulatory Visit (INDEPENDENT_AMBULATORY_CARE_PROVIDER_SITE_OTHER): Payer: BC Managed Care – PPO | Admitting: Physician Assistant

## 2022-05-16 ENCOUNTER — Ambulatory Visit (INDEPENDENT_AMBULATORY_CARE_PROVIDER_SITE_OTHER): Payer: BC Managed Care – PPO | Admitting: Nurse Practitioner

## 2022-05-17 ENCOUNTER — Other Ambulatory Visit (INDEPENDENT_AMBULATORY_CARE_PROVIDER_SITE_OTHER): Payer: Self-pay | Admitting: Physician Assistant

## 2022-05-17 DIAGNOSIS — R7303 Prediabetes: Secondary | ICD-10-CM

## 2022-05-24 ENCOUNTER — Encounter (INDEPENDENT_AMBULATORY_CARE_PROVIDER_SITE_OTHER): Payer: Self-pay | Admitting: Bariatrics

## 2022-05-24 ENCOUNTER — Ambulatory Visit (INDEPENDENT_AMBULATORY_CARE_PROVIDER_SITE_OTHER): Payer: BC Managed Care – PPO | Admitting: Bariatrics

## 2022-05-24 VITALS — BP 117/81 | HR 74 | Temp 98.9°F | Ht 67.0 in | Wt 230.0 lb

## 2022-05-24 DIAGNOSIS — Z6836 Body mass index (BMI) 36.0-36.9, adult: Secondary | ICD-10-CM

## 2022-05-24 DIAGNOSIS — E669 Obesity, unspecified: Secondary | ICD-10-CM

## 2022-05-24 DIAGNOSIS — R7303 Prediabetes: Secondary | ICD-10-CM | POA: Diagnosis not present

## 2022-05-24 DIAGNOSIS — E559 Vitamin D deficiency, unspecified: Secondary | ICD-10-CM | POA: Diagnosis not present

## 2022-05-24 MED ORDER — METFORMIN HCL 500 MG PO TABS: 500.0000 mg | ORAL_TABLET | Freq: Every day | ORAL | 0 refills | Status: DC

## 2022-05-26 NOTE — Progress Notes (Signed)
Chief Complaint:   OBESITY Heather Lee is here to discuss her progress with her obesity treatment plan along with follow-up of her obesity related diagnoses. Heather Lee is on keeping a food journal and adhering to recommended goals of 1200 calories and 80 grams of protein and states she is following her eating plan approximately 0% of the time. Heather Lee states she is doing 0 minutes 0 times per week.  Today's visit was #: 16 Starting weight: 232 lbs Starting date: 12/28/2020 Today's weight: 230 lbs Today's date: 05/24/2022 Total lbs lost to date: 2 lbs Total lbs lost since last in-office visit: 2 lbs  Interim History: Heather Lee is down 2 lbs since her last visit.   Subjective:   1. Vitamin D deficiency Heather Lee is taking Vitamin D currently.   2. Pre-diabetes Heather Lee states her appetite is controlled. She is taking Metformin.   Assessment/Plan:   1. Vitamin D deficiency Low Vitamin D level contributes to fatigue and are associated with obesity, breast, and colon cancer. Heather Lee agrees to continue to take prescription Vitamin D 5,000 IU daily she and will follow-up for routine testing of Vitamin D, at least 2-3 times per year to avoid over-replacement.  2. Pre-diabetes Heather Lee will continue to work on weight loss, exercise, and decreasing simple carbohydrates to help decrease the risk of diabetes. We will refill Metformin 500 mg for 1 month with no refills.   - metFORMIN (GLUCOPHAGE) 500 MG tablet; Take 1 tablet (500 mg total) by mouth daily with breakfast.  Dispense: 30 tablet; Refill: 0  3. Obesity, Current BMI 36.1 Heather Lee is currently in the action stage of change. As such, her goal is to continue with weight loss efforts. She has agreed to keeping a food journal and adhering to recommended goals of 1200 calories and 80 grams of protein.   Exercise goals: No exercise has been prescribed at this time.  Behavioral modification strategies: increasing lean  protein intake, decreasing simple carbohydrates, increasing vegetables, increasing water intake, decreasing eating out, no skipping meals, meal planning and cooking strategies, keeping healthy foods in the home, and planning for success.  Heather Lee has agreed to follow-up with our clinic in 2-3 weeks with Dr. Sharee Holster or myself. She was informed of the importance of frequent follow-up visits to maximize her success with intensive lifestyle modifications for her multiple health conditions.   Objective:   Blood pressure 117/81, pulse 74, temperature 98.9 F (37.2 C), height 5\' 7"  (1.702 m), weight 230 lb (104.3 kg), last menstrual period 06/07/2018, SpO2 98 %. Body mass index is 36.02 kg/m.  General: Cooperative, alert, well developed, in no acute distress. HEENT: Conjunctivae and lids unremarkable. Cardiovascular: Regular rhythm.  Lungs: Normal work of breathing. Neurologic: No focal deficits.   Lab Results  Component Value Date   CREATININE 0.78 01/02/2022   BUN 8 01/02/2022   NA 140 01/02/2022   K 4.2 01/02/2022   CL 102 01/02/2022   CO2 26 01/02/2022   Lab Results  Component Value Date   ALT 21 01/02/2022   AST 22 01/02/2022   ALKPHOS 71 01/02/2022   BILITOT 0.5 01/02/2022   Lab Results  Component Value Date   HGBA1C 5.5 01/02/2022   HGBA1C 5.7 (H) 07/21/2021   HGBA1C 5.7 (H) 12/28/2020   Lab Results  Component Value Date   INSULIN 36.4 (H) 01/02/2022   INSULIN 32.9 (H) 07/21/2021   INSULIN 30.4 (H) 12/28/2020   Lab Results  Component Value Date   TSH 1.770 12/28/2020  Lab Results  Component Value Date   CHOL 175 01/02/2022   HDL 60 01/02/2022   LDLCALC 104 (H) 01/02/2022   TRIG 59 01/02/2022   Lab Results  Component Value Date   VD25OH 53.3 01/02/2022   VD25OH 43.4 07/21/2021   VD25OH 47.0 05/16/2021   Lab Results  Component Value Date   WBC 5.2 12/28/2020   HGB 14.2 12/28/2020   HCT 43.2 12/28/2020   MCV 82 12/28/2020   PLT 375 12/28/2020    No results found for: IRON, TIBC, FERRITIN  Attestation Statements:   Reviewed by clinician on day of visit: allergies, medications, problem list, medical history, surgical history, family history, social history, and previous encounter notes.  I, Jackson Latino, RMA, am acting as Energy manager for [employer], DO.  I have reviewed the above documentation for accuracy and completeness, and I agree with the above. Corinna Capra, DO

## 2022-05-31 ENCOUNTER — Encounter (INDEPENDENT_AMBULATORY_CARE_PROVIDER_SITE_OTHER): Payer: Self-pay | Admitting: Bariatrics

## 2022-06-08 ENCOUNTER — Ambulatory Visit (INDEPENDENT_AMBULATORY_CARE_PROVIDER_SITE_OTHER): Payer: BC Managed Care – PPO | Admitting: Family Medicine

## 2022-06-21 ENCOUNTER — Ambulatory Visit (INDEPENDENT_AMBULATORY_CARE_PROVIDER_SITE_OTHER): Payer: BC Managed Care – PPO | Admitting: Family Medicine

## 2022-06-23 ENCOUNTER — Other Ambulatory Visit (INDEPENDENT_AMBULATORY_CARE_PROVIDER_SITE_OTHER): Payer: Self-pay | Admitting: Bariatrics

## 2022-06-23 DIAGNOSIS — R7303 Prediabetes: Secondary | ICD-10-CM

## 2022-07-27 ENCOUNTER — Other Ambulatory Visit (HOSPITAL_COMMUNITY): Payer: Self-pay | Admitting: Internal Medicine

## 2022-07-27 ENCOUNTER — Ambulatory Visit (HOSPITAL_COMMUNITY)
Admission: RE | Admit: 2022-07-27 | Discharge: 2022-07-27 | Disposition: A | Discharge status: Home / Self Care | Payer: BC Managed Care – PPO | Source: Ambulatory Visit | Attending: Cardiovascular Disease | Admitting: Cardiovascular Disease

## 2022-07-27 DIAGNOSIS — M7989 Other specified soft tissue disorders: Secondary | ICD-10-CM

## 2022-08-02 ENCOUNTER — Encounter (INDEPENDENT_AMBULATORY_CARE_PROVIDER_SITE_OTHER): Payer: Self-pay

## 2022-08-08 ENCOUNTER — Ambulatory Visit: Payer: BC Managed Care – PPO | Admitting: Neurology

## 2022-08-08 ENCOUNTER — Encounter: Payer: Self-pay | Admitting: Neurology

## 2022-08-08 VITALS — BP 115/78 | HR 83 | Ht 66.0 in | Wt 232.0 lb

## 2022-08-08 DIAGNOSIS — M5416 Radiculopathy, lumbar region: Secondary | ICD-10-CM | POA: Diagnosis not present

## 2022-08-08 MED ORDER — GABAPENTIN 300 MG PO CAPS: 300.0000 mg | ORAL_CAPSULE | Freq: Three times a day (TID) | ORAL | 11 refills | Status: DC

## 2022-08-08 NOTE — Progress Notes (Signed)
Chief Complaint  Patient presents with   New Patient (Initial Visit)    Rm 15. Alone. NP/[facility]/Rinka Pahwani MD/possible left foot drop, twitching in eyes States entire left leg drops. Previously had lower back surgery in 2010 for a herniated disc.      ASSESSMENT AND PLAN  Joshua Zeringue Pieratt is a 51 y.o. female   Left leg pain, low back pain, history of lumbar decompression surgery  Most suggestive of left lumbar radiculopathy,  Decreased left patellar reflex, no significant left lower extremity sensory loss, no weakness,  Gabapentin up to 300 mg 3 times a day  MRI of lumbar   Return to clinic in 3 months, will call her MRI report before or next visit  DIAGNOSTIC DATA (LABS, IMAGING, TESTING) - I reviewed patient records, labs, notes, testing and imaging myself where available.  MRI of lumbar spine August 2018, status post left laminectomy at L5-S1, disc and endplate spur causing moderately severe foraminal stenosis on the left side, mild right foraminal narrowing, no significant canal stenosis   MEDICAL HISTORY:  LAYIA WALLA, is a 51 year old female, seen in request by Dr. Pollie Friar for evaluation of left low back pain, left leg pain, initial evaluation was August 08, 2022  I reviewed and summarized the referring note PMHX HTN Pre-DM Depression. Hx of lumbar decompression surgery  She had long history of chronic low back pain, getting worse since 2022, intermittent radiating pain from left back to left lower extremity, left lateral leg, top of left foot, gradually getting worse, she felt constant throbbing discomfort at left lower extremity, rated 8 out of 10, worsening with prolonged standing, walking,  She also complains of dragging her left foot sometimes, denies right-sided symptoms, denies bowel/bladder incontinence, denies upper extremity symptoms,  She had a history of left L5 laminectomy in 2010 for left lumbar  radiculopathy, low back pain  PHYSICAL EXAM:   Vitals:   08/08/22 1337  BP: 115/78  Pulse: 83  Weight: 232 lb (105.2 kg)  Height: 5\' 6"  (1.676 m)   Not recorded     Body mass index is 37.45 kg/m.  PHYSICAL EXAMNIATION:  Gen: NAD, conversant, well nourised, well groomed                     Cardiovascular: Regular rate rhythm, no peripheral edema, warm, nontender. Eyes: Conjunctivae clear without exudates or hemorrhage Neck: Supple, no carotid bruits. Pulmonary: Clear to auscultation bilaterally   NEUROLOGICAL EXAM:  MENTAL STATUS: Speech/cognition: Awake, alert, oriented to history taking and casual conversation, obese CRANIAL NERVES: CN II: Visual fields are full to confrontation. Pupils are round equal and briskly reactive to light. CN III, IV, VI: extraocular movement are normal. No ptosis. CN V: Facial sensation is intact to light touch CN VII: Face is symmetric with normal eye closure  CN VIII: Hearing is normal to causal conversation. CN IX, X: Phonation is normal. CN XI: Head turning and shoulder shrug are intact  MOTOR: Bilateral upper and lower extremity motor strength is normal, no significant distal left leg weakness   REFLEXES: Reflexes are 2+ and symmetric at the biceps, triceps, R2/L1knees, and trace at ankles. Plantar responses are flexor.  SENSORY: Intact to light touch, pinprick and vibratory sensation are intact in fingers and toes.  COORDINATION: There is no trunk or limb dysmetria noted.  GAIT/STANCE: Need push-up to get up from seated position, steady, able to stand up on tiptoes, heels,  REVIEW OF SYSTEMS:  Full 14 system review of systems performed and notable only for as above All other review of systems were negative.   ALLERGIES: Allergies  Allergen Reactions   Ace Inhibitors Other (See Comments)    cough   Amlodipine     Other reaction(s): nausea, headache   Diclofenac     Other reaction(s): rash   Ibuprofen     Other  reaction(s): rash   Other     Dairy    Penicillins Hives and Rash    HOME MEDICATIONS: Current Outpatient Medications  Medication Sig Dispense Refill   acetaminophen (TYLENOL) 500 MG tablet Take by mouth.     Calcium 250 MG CAPS Take 250 mg by mouth in the morning and at bedtime.     Cholecalciferol (VITAMIN D) 125 MCG (5000 UT) CAPS Take 1 capsule by mouth daily. 90 capsule 0   desvenlafaxine (PRISTIQ) 50 MG 24 hr tablet Take 50 mg by mouth daily.     fluticasone (FLONASE) 50 MCG/ACT nasal spray Place 2 sprays into both nostrils daily. 16 g 5   metFORMIN (GLUCOPHAGE) 500 MG tablet Take 1 tablet (500 mg total) by mouth daily with breakfast. 30 tablet 0   Multiple Vitamin (MULTIVITAMIN WITH MINERALS) TABS tablet Take 1 tablet by mouth daily.     olmesartan (BENICAR) 20 MG tablet olmesartan 20 mg tablet  TAKE 1 TABLET BY MOUTH EVERY DAY     No current facility-administered medications for this visit.    PAST MEDICAL HISTORY: Past Medical History:  Diagnosis Date   Anemia    Anxiety    Back pain    Bilateral swelling of feet    Depression    Fatigue    Hypertension    Lactose intolerance    Leg pain    Lower back pain    Poor circulation of extremity    feet   Poor memory    Pre-diabetes    Rheumatoid arthritis (HCC)    Sebaceous cyst of breast, left    Shortness of breath on exertion    Thyromegaly     PAST SURGICAL HISTORY: Past Surgical History:  Procedure Laterality Date   BACK SURGERY     BREAST CYST EXCISION Left 07/02/2018   Procedure: LEFT BREAST SEBACEOUS CYST EXCISION;  Surgeon: [name], MD;  Location: [facility];  Service: General;  Laterality: Left;   BREAST CYST EXCISION Left 06/07/2021   Procedure: LEFT BREAST SEBACEOUS CYST EXCISION;  Surgeon: [name], MD;  Location: [facility];  Service: General;  Laterality: Left;   HYSTEROTOMY  08/31/2018    FAMILY HISTORY: Family History  Problem Relation Age  of Onset   Hypertension Mother    Anxiety disorder Mother    Hypertension Father    Stroke Father    Kidney disease Father     SOCIAL HISTORY: Social History   Socioeconomic History   Marital status: Divorced    Spouse name: Not on file   Number of children: 3   Years of education: Not on file   Highest education level: Not on file  Occupational History   Occupation: Runner, broadcasting/film/video  Tobacco Use   Smoking status: Never   Smokeless tobacco: Never  Vaping Use   Vaping Use: Never used  Substance and Sexual Activity   Alcohol use: Yes    Comment: social   Drug use: Never   Sexual activity: Not Currently  Other Topics Concern   Not on file  Social History Narrative  Not on file   Social Determinants of Health   Financial Resource Strain: Not on file  Food Insecurity: Not on file  Transportation Needs: Not on file  Physical Activity: Not on file  Stress: Not on file  Social Connections: Not on file  Intimate Partner Violence: Not on file      Levert Feinstein, M.D. Ph.D.  [facility] [address], [address] [place], [place] [address] Ph: [phone] Fax: [phone]  CC:  Ollen Bowl, MD [address] [place],  [place] [address]  Maurice Small, MD

## 2022-08-14 ENCOUNTER — Telehealth: Payer: Self-pay | Admitting: Neurology

## 2022-08-14 NOTE — Telephone Encounter (Signed)
LVM asking pt to cb and schedule MRI lumbar spine wo contrast.

## 2022-08-21 ENCOUNTER — Telehealth: Payer: Self-pay | Admitting: Neurology

## 2022-08-21 NOTE — Telephone Encounter (Signed)
Called and LVM for pt to call back and r/s her MRI for the 29th or 30th if she is available to come sooner.

## 2022-08-29 ENCOUNTER — Ambulatory Visit: Payer: BC Managed Care – PPO

## 2022-08-29 MED ORDER — ALPRAZOLAM 0.5 MG PO TABS: ORAL_TABLET | ORAL | 0 refills | Status: DC

## 2022-08-29 NOTE — Addendum Note (Signed)
Addended by: Levert Feinstein on: 08/29/2022 03:54 PM   Modules accepted: Orders

## 2022-08-29 NOTE — Telephone Encounter (Signed)
Meds ordered this encounter  Medications   ALPRAZolam (XANAX) 0.5 MG tablet    Sig: Take 1-2 tablets thirty minutes prior to MRI.  May take one additional tablet before entering scanner, if needed.  MUST HAVE DRIVER.    Dispense:  3 tablet    Refill:  0     

## 2022-08-29 NOTE — Telephone Encounter (Signed)
Orders pended for MD review. 

## 2022-08-29 NOTE — Telephone Encounter (Signed)
Patient is scheduled for tomorrow 9/6 for MRI at [facility], she is requesting meds for the scan. Asking for a nurse to call her please, she wants to make sure she has enough medicine to get through the 30 mins.

## 2022-08-29 NOTE — Addendum Note (Signed)
Addended by: Christophe Louis E on: 08/29/2022 02:43 PM   Modules accepted: Orders

## 2022-08-29 NOTE — Telephone Encounter (Signed)
I spoke with the patient and informed her that medication has been sent to the pharmacy. I instructed her to have a driver to the MRI appointment. She confirmed there were no medication changes since her last visit. She verbalized understanding and expressed appreciation for the call.

## 2022-08-30 ENCOUNTER — Other Ambulatory Visit: Payer: BC Managed Care – PPO

## 2022-08-31 ENCOUNTER — Ambulatory Visit: Payer: BC Managed Care – PPO | Admitting: Neurology

## 2022-09-06 ENCOUNTER — Ambulatory Visit (INDEPENDENT_AMBULATORY_CARE_PROVIDER_SITE_OTHER): Payer: BC Managed Care – PPO

## 2022-09-06 DIAGNOSIS — M5416 Radiculopathy, lumbar region: Secondary | ICD-10-CM

## 2022-09-12 ENCOUNTER — Telehealth: Payer: Self-pay | Admitting: Neurology

## 2022-09-12 NOTE — Telephone Encounter (Signed)
Reviewed results with pt that were sent through mychart. Pt states she would like to further discuss with Dr Krista Blue, Back pain has worsen and she has difficulty following her daily routine. Pt has not started taking Gabapentin, states she is not comfortable taking medication due to her hx of depression. Pt asked if she could speak to MD further about next steps and treatment plan, requested phone call or VV.

## 2022-09-12 NOTE — Telephone Encounter (Signed)
On schedule for Nov, it is OK to switch to VV for next available.

## 2022-09-12 NOTE — Telephone Encounter (Signed)
Pt have reviewed MRI results on MyChart. Would like a call from the nurse.

## 2022-09-14 NOTE — Telephone Encounter (Signed)
LVM for pt to call back schedule a VV appt.

## 2022-10-13 ENCOUNTER — Other Ambulatory Visit: Payer: Self-pay

## 2022-10-13 MED ORDER — COVID-19 MRNA 2023-2024 VACCINE (COMIRNATY) 0.3 ML INJECTION
INTRAMUSCULAR | 0 refills | Status: DC
  Filled 2022-10-13: qty 0.3, 1d supply, fill #0

## 2022-10-24 ENCOUNTER — Ambulatory Visit (INDEPENDENT_AMBULATORY_CARE_PROVIDER_SITE_OTHER): Payer: BC Managed Care – PPO | Admitting: Family Medicine

## 2022-10-24 ENCOUNTER — Encounter (INDEPENDENT_AMBULATORY_CARE_PROVIDER_SITE_OTHER): Payer: Self-pay | Admitting: Family Medicine

## 2022-10-24 VITALS — BP 122/85 | HR 94 | Temp 98.7°F | Ht 67.0 in | Wt 240.0 lb

## 2022-10-24 DIAGNOSIS — F3289 Other specified depressive episodes: Secondary | ICD-10-CM | POA: Diagnosis not present

## 2022-10-24 DIAGNOSIS — R7303 Prediabetes: Secondary | ICD-10-CM | POA: Insufficient documentation

## 2022-10-24 DIAGNOSIS — Z6837 Body mass index (BMI) 37.0-37.9, adult: Secondary | ICD-10-CM

## 2022-10-24 DIAGNOSIS — E669 Obesity, unspecified: Secondary | ICD-10-CM | POA: Diagnosis not present

## 2022-10-24 DIAGNOSIS — R4589 Other symptoms and signs involving emotional state: Secondary | ICD-10-CM

## 2022-10-24 NOTE — Progress Notes (Signed)
Office: [phone]  /  Fax: [phone]    Date: 10/30/2022   Appointment Start Time: 8:58am Duration: 48 minutes Provider: Glennie Isle, Psy.D. Type of Session: Intake for Individual Therapy  Location of Patient: Home (private location) Location of Provider: Provider's home (private office) Type of Contact: Telepsychological Visit via MyChart Video Visit  Informed Consent: Prior to proceeding with today's appointment, two pieces of identifying information were obtained. In addition, Cyndel's physical location at the time of this appointment was obtained as well a phone number she could be reached at in the event of technical difficulties. Ahuva and this provider participated in today's telepsychological service.   The provider's role was explained to [employer]. The provider reviewed and discussed issues of confidentiality, privacy, and limits therein (e.g., reporting obligations). In addition to verbal informed consent, written informed consent for psychological services was obtained prior to the initial appointment. Since the clinic is not a 24/7 crisis center, mental health emergency resources were shared and this  provider explained MyChart, e-mail, voicemail, and/or other messaging systems should be utilized only for non-emergency reasons. This provider also explained that information obtained during appointments will be placed in Taja's medical record and relevant information will be shared with other providers at [facility] for coordination of care. Trea agreed information may be shared with other [facility] providers as needed for coordination of care and by signing the service agreement document, she provided written consent for coordination of care. Prior to initiating telepsychological services, Demie completed an informed consent document, which included the development of a safety plan (i.e., an emergency contact  and emergency resources) in the event of an emergency/crisis. Cailin verbally acknowledged understanding she is ultimately responsible for understanding her insurance benefits for telepsychological and in-person services. This provider also reviewed confidentiality, as it relates to telepsychological services. Debborah  acknowledged understanding that appointments cannot be recorded without both party consent and she is aware she is responsible for securing confidentiality on her end of the session. Breyonna verbally consented to proceed.  Chief Complaint/HPI: Ahmirah was referred by Dr. Mellody Dance due to  depressed mood, with emotional eating  on 10/24/2022. The note for the initial appointment with Dr. Dennard Nip on 12/28/2020 indicated the following: "Karimah's habits were reviewed today and are as follows: Her family eats meals together, she thinks her family will eat healthier with her, her desired weight loss is 82 lbs, she has been heavy most of her life, she started gaining weight in 1995 when she was married, her heaviest weight ever was 231 pounds, she has significant food cravings issues, she snacks frequently in the evenings, she is frequently drinking liquids with calories, she frequently eats larger portions than normal and she struggles with emotional eating." Arizbeth's Food and Mood (modified PHQ-9) score on 12/28/2020 was 18.  During today's appointment, Ibtisam stated she has "yo-yo'ed" since starting with the clinic, and described feeling "really unmotivated." She was verbally administered a questionnaire assessing various behaviors related to emotional eating behaviors. Elvena endorsed the following: overeat when you are celebrating, experience food cravings on a regular basis, eat certain foods when you are anxious, stressed, depressed, or your feelings are hurt, use food to help you cope with emotional situations, find food is comforting to you, overeat when you  are angry or upset, overeat when you are worried about something, overeat frequently when you are bored or lonely, and eat as a reward. She shared she craves "sugar, sweets, baked items,  and frosting in particular." Yuni believes the onset of emotional eating behaviors was likely 30 years ago during her marriage, noting she is divorced. She described the current frequency of emotional eating behaviors as "few times a week." In addition, Charina denied a history of binge eating behaviors. Toni denied a history of significantly restricting food intake, purging and engagement in other compensatory strategies, and has never been diagnosed with an eating disorder. She also denied a history of treatment for emotional eating. Furthermore, Lavon stated she is currently not following a structured meal plan, noting it was recommended by Dr. Raliegh Scarlet she focus on "lean protein, fruits and vegetables." She added, "I haven't been following that."  Mental Status Examination:  Appearance: neat Behavior: appropriate to circumstances Mood: sad Affect: mood congruent Speech: WNL Eye Contact: appropriate Psychomotor Activity: WNL Gait: unable to assess  Thought Process: linear, logical, and goal directed and denies suicidal, homicidal, and self-harm ideation, plan and intent  Thought Content/Perception: no hallucinations, delusions, bizarre thinking or behavior endorsed or observed Orientation: AAOx4 Memory/Concentration: memory, attention, language, and fund of knowledge intact  Insight/Judgment: fair  Family & Psychosocial History: Teonna reported she is divorced, and she has two adult daughters. 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 Additionally, Patresa shared her highest level of education obtained is a master's degree. Currently, Drianna's social support system consists of her mother, younger brother, daughters, nine uncles and aunts, and  58 first-cousins. Moreover, Jelesa stated she resides with her oldest daughter, daughter's puppy, and daughter's friend.  Medical History:  Past Medical History:  Diagnosis Date   Anemia    Anxiety    Back pain    Bilateral swelling of feet    Depression    Fatigue    Hypertension    Lactose intolerance    Leg pain    Lower back pain    Poor circulation of extremity    feet   Poor memory    Pre-diabetes    Rheumatoid arthritis (HCC)    Sebaceous cyst of breast, left    Shortness of breath on exertion    Thyromegaly    Past Surgical History:  Procedure Laterality Date   BACK SURGERY     BREAST CYST EXCISION Left 07/02/2018   Procedure: LEFT BREAST SEBACEOUS CYST EXCISION;  Surgeon: Rolm Bookbinder, MD;  Location: [facility];  Service: General;  Laterality: Left;   BREAST CYST EXCISION Left 06/07/2021   Procedure: LEFT BREAST SEBACEOUS CYST EXCISION;  Surgeon: Rolm Bookbinder, MD;  Location: [facility];  Service: General;  Laterality: Left;   HYSTEROTOMY  08/31/2018   Current Outpatient Medications on File Prior to Visit  Medication Sig Dispense Refill   acetaminophen (TYLENOL) 500 MG tablet Take by mouth.     ALPRAZolam (XANAX) 0.5 MG tablet Take 1-2 tablets thirty minutes prior to MRI.  May take one additional tablet before entering scanner, if needed.  MUST HAVE DRIVER. 3 tablet 0   Calcium 250 MG CAPS Take 250 mg by mouth in the morning and at bedtime.     Cholecalciferol (VITAMIN D) 125 MCG (5000 UT) CAPS Take 1 capsule by mouth daily. 90 capsule 0   COVID-19 mRNA vaccine 2023-2024 (COMIRNATY) SUSP injection Inject into the muscle. 0.3 mL 0   desvenlafaxine (PRISTIQ) 50 MG 24 hr tablet Take 50 mg by mouth daily.     fluticasone (FLONASE) 50 MCG/ACT nasal spray Place 2 sprays into both nostrils daily. 16 g  5   gabapentin (NEURONTIN) 300 MG capsule Take 1 capsule (300 mg total) by mouth 3 (three) times daily. 90 capsule 11   metFORMIN  (GLUCOPHAGE) 500 MG tablet Take 1 tablet (500 mg total) by mouth daily with breakfast. (Patient not taking: Reported on 10/24/2022) 30 tablet 0   Multiple Vitamin (MULTIVITAMIN WITH MINERALS) TABS tablet Take 1 tablet by mouth daily.     olmesartan (BENICAR) 20 MG tablet olmesartan 20 mg tablet  TAKE 1 TABLET BY MOUTH EVERY DAY     No current facility-administered medications on file prior to visit.  Palmyra stated she is medication compliant.  Mental Health History: Glenna reported she attended therapeutic services after her divorce and after having a still born birth 66 years ago. She stated she is currently prescribed Pristiq by her gynecologist, noting it is helpful. Kath reported there is no history of hospitalizations for psychiatric concerns. Kerissa denied a family history of mental health/substance abuse related concerns. Regarding trauma history, Nineveh described losing her first child as traumatic. She denied a history of psychological, physical , and sexual abuse, as well as neglect.   Cona described experiencing passive suicidal ideation ("I'm tired of being sad inwardly and masking outside.") during her divorce and other times during her life, noting the last time she experienced passive suicide ideation was "months and months" ago. She described the thoughts as fleeting. She denied ever experiencing suicidal plan and intent. The following protective factors were identified for Paulena: daughters, "myself," desire to be remarried, future/retirement, and desire to travel. If she were to become overwhelmed in the future, which is a sign that a crisis may occur, she identified the following coping skills she could engage in: walk outside, talk to friends, pray, and read Bible and motivational books. It was recommended the aforementioned be written down and developed into a coping card for future reference. She was observed writing. Psychoeducation regarding the  importance of reaching out to a trusted individual and/or utilizing emergency resources if there is a change in emotional status and/or there is an inability to ensure safety was provided. Shawnie's confidence in reaching out to a trusted individual and/or utilizing emergency resources should there be an intensification in emotional status and/or there is an inability to ensure safety was assessed on a scale of one to ten where one is not confident and ten is extremely confident. She reported her confidence is a 10. Additionally, Pranathi denied current access to firearms and/or weapons.   Parlee described her typical mood lately as "really depressed," noting her depression "ebbs and flows." She further shared she will "zone out" during virtual meetings as meeting platforms keep changing at work. She also described experiencing forgetfulness when overwhelmed and stressed. Additionally, she described experiencing racing thoughts. Regarding symptoms of anxiety, she indicated experiencing worry "all [her] life" and described her worry as generalized.  Orly denied current alcohol use. She denied tobacco use.  She denied illicit/recreational substance use. Furthermore, Mackensey indicated she is not experiencing the following: hallucinations and delusions, paranoia, symptoms of mania , social withdrawal, crying spells, panic attacks, symptoms of trauma, memory concerns, and obsessions and compulsions. She also denied current suicidal ideation, plan, and intent; history of and current homicidal ideation, plan, and intent; and history of and current engagement in self-harm.  Legal History: Zamia disclosed approximately 25 years ago she was arrested and charged with "something" for domestic violence. She indicated her record was expunged.   Structured Assessments Results: The Patient Health Questionnaire-9 (PHQ-9) is  a self-report measure that assesses symptoms and severity of depression over the  course of the last two weeks. Shavonta obtained a score of 11 suggesting moderate depression. Shakeya finds the endorsed symptoms to be very difficult. [0= Not at all; 1= Several days; 2= More than half the days; 3= Nearly every day] Little interest or pleasure in doing things 0  Feeling down, depressed, or hopeless 3  Trouble falling or staying asleep, or sleeping too much 0  Feeling tired or having little energy 2  Poor appetite or overeating 0  Feeling bad about yourself --- or that you are a failure or have let yourself or your family down 3  Trouble concentrating on things, such as reading the newspaper or watching television 3  Moving or speaking so slowly that other people could have noticed? Or the opposite --- being so fidgety or restless that you have been moving around a lot more than usual 0  Thoughts that you would be better off dead or hurting yourself in some way 0  PHQ-9 Score 11    The Generalized Anxiety Disorder-7 (GAD-7) is a brief self-report measure that assesses symptoms of anxiety over the course of the last two weeks. Velecia obtained a score of 18 suggesting severe anxiety. Wallace finds the endorsed symptoms to be extremely difficult. [0= Not at all; 1= Several days; 2= Over half the days; 3= Nearly every day] Feeling nervous, anxious, on edge 3  Not being able to stop or control worrying 3  Worrying too much about different things- e.g., work, finances, daughters' well-being 3  Trouble relaxing 3  Being so restless that it's hard to sit still 3  Becoming easily annoyed or irritable 3  Feeling afraid as if something awful might happen 0  GAD-7 Score 18   Interventions:  Conducted a chart review Focused on rapport building Verbally administered PHQ-9 and GAD-7 for symptom monitoring Verbally administered Food & Mood questionnaire to assess various behaviors related to emotional eating Provided emphatic reflections and validation Psychoeducation  provided regarding physical versus emotional hunger Conducted a risk assessment Developed a coping card Recommended/discussed option for longer-term therapeutic services  Diagnostic Impressions & Provisional DSM-5 Diagnosis(es): Hennie reported she began engaging in emotional eating behaviors 30 years ago and described the current frequency as few times a week. She denied engaging in any other disordered eating behaviors. She also reported a history of depression and anxiety throughout her life, and endorsed various items on the administered measures. Based on the aforementioned, the following diagnoses were assigned: F50.89 Other Specified Feeding or Eating Disorder, Emotional Eating Behaviors, F41.1 Generalized Anxiety Disorder, and F33.1  Major Depressive Disorder, Recurrent Episode, Moderate. This provider recommended traditional therapeutic services to address symptoms of depression and anxiety.   Plan: Zsazsa appears able and willing to participate as evidenced by collaboration on a treatment goal, engagement in reciprocal conversation, and asking questions as needed for clarification. The next appointment is scheduled for 11/14/2022 at 8am, which will be via [place] Visit. The following treatment goal was established: increase coping skills. This provider will regularly review the treatment plan and medical chart to keep informed of status changes. Jynesis expressed understanding and agreement with the initial treatment plan of care. Niajah will be sent a handout via e-mail to utilize between now and the next appointment to increase awareness of hunger patterns and subsequent eating. Genevive provided verbal consent during today's appointment for this provider to send the handout via e-mail. Additionally, Geni Bers provided verbal consent  for this provider to e-mail referral options and she agreed to establish care with a provider prior to the next appointment with this  provider.

## 2022-10-30 ENCOUNTER — Telehealth (INDEPENDENT_AMBULATORY_CARE_PROVIDER_SITE_OTHER): Payer: BC Managed Care – PPO | Admitting: Psychology

## 2022-10-30 DIAGNOSIS — F331 Major depressive disorder, recurrent, moderate: Secondary | ICD-10-CM | POA: Diagnosis not present

## 2022-10-30 DIAGNOSIS — F5089 Other specified eating disorder: Secondary | ICD-10-CM | POA: Diagnosis not present

## 2022-10-30 DIAGNOSIS — F411 Generalized anxiety disorder: Secondary | ICD-10-CM | POA: Diagnosis not present

## 2022-11-02 NOTE — Progress Notes (Signed)
Chief Complaint:   OBESITY Heather Lee is here to discuss her progress with her obesity treatment plan along with follow-up of her obesity related diagnoses. Heather Lee is on keeping a food journal and adhering to recommended goals of 1200 calories and 80 protein and states she is following her eating plan approximately 0% of the time. Heather Lee states she is not exercising.   Today's visit was #: 17 Starting weight: 232 lbs Starting date: 12/28/2020 Today's weight: 240 lbs Today's date: 10/24/2022 Total lbs lost to date: 0 Total lbs lost since last in-office visit: +10 lbs  Interim History: Last office visit with Dr Manson Passey, 05/24/2022.  1st office visit with me.  She has been struggling emotionally and not ready to make changes to her diet. She feels stuck and that her depression could be playing a role after much discussion today.   Subjective:   1. Pre-diabetes She is off metformin for about 3 months, because patient was lost to follow up.  She loves carbohydrates, breads, pasta, rice, etc and sweets.  Pt not following any meal plan.    2. Other depression, with emotional eating She is on Pristiq per GYN Dr Bo Mcclintock for depression.  "I am not consistent with habits and lack motivation".  She has never seen a counselor in many years.   Assessment/Plan:  No orders of the defined types were placed in this encounter.   There are no discontinued medications.   No orders of the defined types were placed in this encounter.    1. Pre-diabetes Patient declines starting metformin today and will consider restart when she's ready to follow meal plan.   2. Other depression, with emotional eating 1) Refer to Dr Dewaine Conger for emotional eating counseling. 2) Medication management of depression per GYN. 3) Read atomic habits. 4) Possible intuitive eating workbook.   3. Obesity, Current BMI 37.6 She will focus on increasing lean protein and increase fruits and veggies only.  Do not focus  on weight loss.   Heather Lee is currently in the action stage of change. As such, her goal is to continue with weight loss efforts. She has agreed to practicing portion control and making smarter food choices, such as increasing vegetables and decreasing simple carbohydrates.   Exercise goals:  As is.   Behavioral modification strategies: increasing lean protein intake and decreasing simple carbohydrates.  Heather Lee has agreed to follow-up with our clinic in 2-3 weeks. She was informed of the importance of frequent follow-up visits to maximize her success with intensive lifestyle modifications for her multiple health conditions.   Heather Lee was informed we would discuss her lab results at her next visit unless there is a critical issue that needs to be addressed sooner. Heather Lee agreed to keep her next visit at the agreed upon time to discuss these results.  Objective:   Blood pressure 122/85, pulse 94, temperature 98.7 F (37.1 C), height 5\' 7"  (1.702 m), weight 240 lb (108.9 kg), last menstrual period 06/07/2018, SpO2 96 %. Body mass index is 37.59 kg/m.  General: Cooperative, alert, well developed, in no acute distress. HEENT: Conjunctivae and lids unremarkable. Cardiovascular: Regular rhythm.  Lungs: Normal work of breathing. Neurologic: No focal deficits.   Lab Results  Component Value Date   CREATININE 0.78 01/02/2022   BUN 8 01/02/2022   NA 140 01/02/2022   K 4.2 01/02/2022   CL 102 01/02/2022   CO2 26 01/02/2022   Lab Results  Component Value Date   ALT 21 01/02/2022  AST 22 01/02/2022   ALKPHOS 71 01/02/2022   BILITOT 0.5 01/02/2022   Lab Results  Component Value Date   HGBA1C 5.5 01/02/2022   HGBA1C 5.7 (H) 07/21/2021   HGBA1C 5.7 (H) 12/28/2020   Lab Results  Component Value Date   INSULIN 36.4 (H) 01/02/2022   INSULIN 32.9 (H) 07/21/2021   INSULIN 30.4 (H) 12/28/2020   Lab Results  Component Value Date   TSH 1.770 12/28/2020   Lab Results   Component Value Date   CHOL 175 01/02/2022   HDL 60 01/02/2022   LDLCALC 104 (H) 01/02/2022   TRIG 59 01/02/2022   Lab Results  Component Value Date   VD25OH 53.3 01/02/2022   VD25OH 43.4 07/21/2021   VD25OH 47.0 05/16/2021   Lab Results  Component Value Date   WBC 5.2 12/28/2020   HGB 14.2 12/28/2020   HCT 43.2 12/28/2020   MCV 82 12/28/2020   PLT 375 12/28/2020   No results found for: "IRON", "TIBC", "FERRITIN"  Attestation Statements:   Reviewed by clinician on day of visit: allergies, medications, problem list, medical history, surgical history, family history, social history, and previous encounter notes.  Time spent on visit including pre-visit chart review and post-visit care and charting was 40 minutes.   I, Malcolm Metro, RMA, am acting as Energy manager for [employer], DO.   I have reviewed the above documentation for accuracy and completeness, and I agree with the above. Carlye Grippe, D.O.  The 21st Century Cures Act was signed into law in 2016 which includes the topic of electronic health records.  This provides immediate access to information in MyChart.  This includes consultation notes, operative notes, office notes, lab results and pathology reports.  If you have any questions about what you read please let us know at your next visit so we can discuss your concerns and take corrective action if need be.  We are right here with you.

## 2022-11-07 ENCOUNTER — Encounter (INDEPENDENT_AMBULATORY_CARE_PROVIDER_SITE_OTHER): Payer: Self-pay | Admitting: Family Medicine

## 2022-11-07 ENCOUNTER — Ambulatory Visit (INDEPENDENT_AMBULATORY_CARE_PROVIDER_SITE_OTHER): Payer: BC Managed Care – PPO | Admitting: Family Medicine

## 2022-11-07 VITALS — BP 120/79 | HR 88 | Temp 99.6°F | Ht 67.0 in | Wt 239.0 lb

## 2022-11-07 DIAGNOSIS — R7303 Prediabetes: Secondary | ICD-10-CM

## 2022-11-07 DIAGNOSIS — Z6837 Body mass index (BMI) 37.0-37.9, adult: Secondary | ICD-10-CM

## 2022-11-07 DIAGNOSIS — E669 Obesity, unspecified: Secondary | ICD-10-CM

## 2022-11-07 DIAGNOSIS — E559 Vitamin D deficiency, unspecified: Secondary | ICD-10-CM

## 2022-11-07 DIAGNOSIS — F32A Depression, unspecified: Secondary | ICD-10-CM | POA: Insufficient documentation

## 2022-11-07 DIAGNOSIS — F3289 Other specified depressive episodes: Secondary | ICD-10-CM | POA: Diagnosis not present

## 2022-11-13 ENCOUNTER — Ambulatory Visit: Payer: BC Managed Care – PPO | Admitting: Neurology

## 2022-11-14 ENCOUNTER — Telehealth (INDEPENDENT_AMBULATORY_CARE_PROVIDER_SITE_OTHER): Payer: BC Managed Care – PPO | Admitting: Psychology

## 2022-11-14 ENCOUNTER — Telehealth (INDEPENDENT_AMBULATORY_CARE_PROVIDER_SITE_OTHER): Payer: Self-pay | Admitting: Psychology

## 2022-11-14 NOTE — Telephone Encounter (Signed)
  Office: [phone]  /  Fax: [phone]  Date of Call: November 14, 2022  Time of Call: 8:02am Duration of Call: ~2 minute(s) Provider: Lawerance Cruel, PsyD  CONTENT: This provider called Heather Lee to check-in as she did not present for today's MyChart Video Visit appointment at 8am. She stated she thought the appointment was later in the day and requested to reschedule. Heather Lee acknowledged understanding the one time no show fee waiver would be applied. A brief risk assessment was completed. Heather Lee denied experiencing suicidal, self-harm, and homicidal ideation, plan, or intent since the last appointment with this provider. All questions/concerns addressed.   PLAN: Heather Lee is scheduled for an appointment on 11/20/2022 at 8:30am via MyChart Video Visit.

## 2022-11-14 NOTE — Progress Notes (Incomplete)
  Office: [phone]  /  Fax: [phone]    Date: November 14, 2022    Appointment Start Time: *** Duration: *** minutes Provider: Lawerance Cruel, Psy.D. Type of Session: Individual Therapy  Location of Patient: {gbptloc:23249} (private location) Location of Provider: Provider's Home (private office) Type of Contact: Telepsychological Visit via MyChart Video Visit  Session Content:This provider called Heather Lee at 8:02am as she did not present for today's appointment. *** As such, today's appointment was initiated *** minutes late. Heather Lee is a 51 y.o. female presenting for a follow-up appointment to address the previously established treatment goal of increasing coping skills.Today's appointment was a telepsychological visit. Heather Lee provided verbal consent for today's telepsychological appointment and she is aware she is responsible for securing confidentiality on her end of the session. Prior to proceeding with today's appointment, Heather Lee's physical location at the time of this appointment was obtained as well a phone number she could be reached at in the event of technical difficulties. Heather Lee and this provider participated in today's telepsychological service.   This provider conducted a brief check-in. *** Heather Lee was receptive to today's appointment as evidenced by openness to sharing, responsiveness to feedback, and {gbreceptiveness:23401}.  Mental Status Examination:  Appearance: {Appearance:22431} Behavior: {Behavior:22445} Mood: {gbmood:21757} Affect: {Affect:22436} Speech: {Speech:22432} Eye Contact: {Eye Contact:22433} Psychomotor Activity: {Motor Activity:22434} Gait: {gbgait:23404} Thought Process: {thought process:22448}  Thought Content/Perception: {disturbances:22451} Orientation: {Orientation:22437} Memory/Concentration: {gbcognition:22449} Insight: {Insight:22446} Judgment: {Insight:22446}  Interventions:  {Interventions for Progress  Notes:23405}  DSM-5 Diagnosis(es):   F50.89 Other Specified Feeding or Eating Disorder, Emotional Eating Behaviors, F41.1 Generalized Anxiety Disorder, and F33.1  Major Depressive Disorder, Recurrent Episode, Moderate  Treatment Goal & Progress: During the initial appointment with this provider, the following treatment goal was established: increase coping skills. Heather Lee has demonstrated progress in her goal as evidenced by {gbtxprogress:22839}. Heather Lee also {gbtxprogress2:22951}.  Plan: The next appointment is scheduled for *** at ***, which will be via MyChart Video Visit. The next session will focus on {Plan for Next Appointment:23400}.

## 2022-11-17 NOTE — Progress Notes (Signed)
Chief Complaint:   OBESITY Heather Lee is here to discuss her progress with her obesity treatment plan along with follow-up of her obesity related diagnoses. Heather Lee is on practicing portion control and making smarter food choices, such as increasing vegetables and decreasing simple carbohydrates and states she is following her eating plan approximately 50% of the time. Heather Lee states she is not exercising.  Today's visit was #: 42 Starting weight: 232 lbs Starting date: 12/28/2020 Today's weight: 239 lbs Today's date: 11/07/2022 Total lbs lost to date: 0 Total lbs lost since last in-office visit: 1 lb  Interim History: She met with Dr Mallie Mussel recently.  She felt good.  Patient happy to be moving forward with her weight loss process.  She likes the PC/Mikes she is much more intentional and mindful with her eating food choices.   Subjective:   1. Pre-diabetes Patient off metformin 4 months now.  No hunger or cravings and does not think she needs it now.    2. Vitamin D deficiency She is currently taking OTC vitamin D 5,000 IU each day. She denies nausea, vomiting or muscle weakness. Last Vitamin D level 53.3.  3. Other depression-with emotional eating Patient did look into atomic habits book and intentional eating workbook.  Mood improving from prior.  Feels more focused in control.   Assessment/Plan:  No orders of the defined types were placed in this encounter.   There are no discontinued medications.   No orders of the defined types were placed in this encounter.    1. Pre-diabetes Continue prudent nutritional plan and weight loss and increase movement. Will recheck labs next office visit.   2. Vitamin D deficiency Continue OTC Vitamin D.  Recheck levels at next office visit.   3. Other depression-with emotional eating Continue Pristiq per GYN doctor.   4. Obesity, Current BMI 37.4 Heather Lee is currently in the action stage of change. As such, her goal is to  continue with weight loss efforts. She has agreed to practicing portion control and making smarter food choices, such as increasing vegetables and decreasing simple carbohydrates.   Exercise goals: All adults should avoid inactivity. Some physical activity is better than none, and adults who participate in any amount of physical activity gain some health benefits.  Behavioral modification strategies: increasing lean protein intake, decreasing simple carbohydrates, and avoiding temptations.  Heather Lee has agreed to follow-up with our clinic in 3 weeks. She was informed of the importance of frequent follow-up visits to maximize her success with intensive lifestyle modifications for her multiple health conditions.   Objective:   Blood pressure 120/79, pulse 88, temperature 99.6 F (37.6 C), height _0  (1.702 m), weight 239 lb (108.4 kg), last menstrual period 06/07/2018, SpO2 99 %. Body mass index is 37.43 kg/m.  General: Cooperative, alert, well developed, in no acute distress. HEENT: Conjunctivae and lids unremarkable. Cardiovascular: Regular rhythm.  Lungs: Normal work of breathing. Neurologic: No focal deficits.   Lab Results  Component Value Date   CREATININE 0.78 01/02/2022   BUN 8 01/02/2022   NA 140 01/02/2022   K 4.2 01/02/2022   CL 102 01/02/2022   CO2 26 01/02/2022   Lab Results  Component Value Date   ALT 21 01/02/2022   AST 22 01/02/2022   ALKPHOS 71 01/02/2022   BILITOT 0.5 01/02/2022   Lab Results  Component Value Date   HGBA1C 5.5 01/02/2022   HGBA1C 5.7 (H) 07/21/2021   HGBA1C 5.7 (H) 12/28/2020   Lab Results  Component Value Date   INSULIN 36.4 (H) 01/02/2022   INSULIN 32.9 (H) 07/21/2021   INSULIN 30.4 (H) 12/28/2020   Lab Results  Component Value Date   TSH 1.770 12/28/2020   Lab Results  Component Value Date   CHOL 175 01/02/2022   HDL 60 01/02/2022   LDLCALC 104 (H) 01/02/2022   TRIG 59 01/02/2022   Lab Results  Component Value Date    VD25OH 53.3 01/02/2022   VD25OH 43.4 07/21/2021   VD25OH 47.0 05/16/2021   Lab Results  Component Value Date   WBC 5.2 12/28/2020   HGB 14.2 12/28/2020   HCT 43.2 12/28/2020   MCV 82 12/28/2020   PLT 375 12/28/2020   No results found for: "IRON", "TIBC", "FERRITIN"  Attestation Statements:   Reviewed by clinician on day of visit: allergies, medications, problem list, medical history, surgical history, family history, social history, and previous encounter notes.  I, Davy Pique, RMA, am acting as Location manager for [employer], DO.   I have reviewed the above documentation for accuracy and completeness, and I agree with the above. Marjory Sneddon, D.O.  The [place] was signed into law in 2016 which includes the topic of electronic health records.  This provides immediate access to information in MyChart.  This includes consultation notes, operative notes, office notes, lab results and pathology reports.  If you have any questions about what you read please let us know at your next visit so we can discuss your concerns and take corrective action if need be.  We are right here with you.

## 2022-11-20 ENCOUNTER — Telehealth (INDEPENDENT_AMBULATORY_CARE_PROVIDER_SITE_OTHER): Payer: BC Managed Care – PPO | Admitting: Psychology

## 2022-11-20 DIAGNOSIS — F411 Generalized anxiety disorder: Secondary | ICD-10-CM | POA: Diagnosis not present

## 2022-11-20 DIAGNOSIS — F331 Major depressive disorder, recurrent, moderate: Secondary | ICD-10-CM

## 2022-11-20 DIAGNOSIS — F5089 Other specified eating disorder: Secondary | ICD-10-CM

## 2022-11-20 NOTE — Progress Notes (Signed)
  Office: [phone]  /  Fax: [phone]    Date: November 20, 2022    Appointment Start Time: 8:30am Duration: 27 minutes Provider: Lawerance Cruel, Psy.D. Type of Session: Individual Therapy  Location of Patient: Home (private location) Location of Provider: Provider's Home (private office) Type of Contact: Telepsychological Visit via MyChart Video Visit  Session Content: Heather Lee is a 51 y.o. female presenting for a follow-up appointment to address the previously established treatment goal of increasing coping skills.Today's appointment was a telepsychological visit. Heather Lee provided verbal consent for today's telepsychological appointment and she is aware she is responsible for securing confidentiality on her end of the session. Prior to proceeding with today's appointment, Heather Lee's physical location at the time of this appointment was obtained as well a phone number she could be reached at in the event of technical difficulties. Heather Lee and this provider participated in today's telepsychological service.   This provider conducted a brief check-in. Heather Lee stated she is "making strides," adding she is "walking daily," focusing on water intake, and consistent with "intermittent fasting" and making better food choices. Further explored and processed. She identified setting short-term goals has been helpful. Notably, Heather Lee indicated she did not follow-up with the shared referrals, and agreed to establish care with a provider for traditional therapeutic services between now and the next appointment with this provider. Heather Lee expressed concern regarding her ability to continue to do well with walking, water intake, and making better food choices. This was further explored and Heather Lee reflected on what has helped her be successful in the past couple weeks (e.g., consistent sleep routine, having specific foods in the home, taking lunch to work). She was encouraged to write  down the aforementioned for future reference. Moreover, psychoeducation regarding all or nothing was provided. Heather Lee did not endorse experiencing suicidal, self-injurious, and homicidal ideation, plan, or intent since the last appointment with this provider and described future plans that include ongoing efforts to improve her well-being and eating habits. Overall, Heather Lee was receptive to today's appointment as evidenced by openness to sharing, responsiveness to feedback, and willingness to implement discussed strategies .  Mental Status Examination:  Appearance: neat Behavior: appropriate to circumstances Mood: neutral Affect: mood congruent Speech: WNL Eye Contact: appropriate Psychomotor Activity: WNL Gait: unable to assess Thought Process: linear, logical, and goal directed and no evidence or endorsement of suicidal, homicidal, and self-harm ideation, plan and intent  Thought Content/Perception: no hallucinations, delusions, bizarre thinking or behavior endorsed or observed Orientation: AAOx4 Memory/Concentration: memory, attention, language, and fund of knowledge intact  Insight: fair Judgment: fair  Interventions:  Conducted a brief chart review Provided empathic reflections and validation Provided positive reinforcement Employed supportive psychotherapy interventions to facilitate reduced distress and to improve coping skills with identified stressors Engaged patient in problem solving Psychoeducation provided regarding all-or-nothing thinking  DSM-5 Diagnosis(es):  F50.89 Other Specified Feeding or Eating Disorder, Emotional Eating Behaviors, F41.1 Generalized Anxiety Disorder, and F33.1  Major Depressive Disorder, Recurrent Episode, Moderate  Treatment Goal & Progress: During the initial appointment with this provider, the following treatment goal was established: increase coping skills.Progress is limited, as Heather Lee has just begun treatment with this provider;  however, she is receptive to the interaction and interventions and rapport is being established.   Plan: The next appointment is scheduled for 12/05/2022 at 8:30am, which will be via MyChart Video Visit. The next session will focus on working towards the established treatment goal.

## 2022-12-04 ENCOUNTER — Encounter (INDEPENDENT_AMBULATORY_CARE_PROVIDER_SITE_OTHER): Payer: Self-pay | Admitting: Family Medicine

## 2022-12-04 ENCOUNTER — Ambulatory Visit (INDEPENDENT_AMBULATORY_CARE_PROVIDER_SITE_OTHER): Payer: BC Managed Care – PPO | Admitting: Family Medicine

## 2022-12-05 ENCOUNTER — Telehealth (INDEPENDENT_AMBULATORY_CARE_PROVIDER_SITE_OTHER): Payer: BC Managed Care – PPO | Admitting: Psychology

## 2022-12-05 DIAGNOSIS — F331 Major depressive disorder, recurrent, moderate: Secondary | ICD-10-CM | POA: Diagnosis not present

## 2022-12-05 DIAGNOSIS — F411 Generalized anxiety disorder: Secondary | ICD-10-CM | POA: Diagnosis not present

## 2022-12-05 DIAGNOSIS — F5089 Other specified eating disorder: Secondary | ICD-10-CM | POA: Diagnosis not present

## 2022-12-05 NOTE — Progress Notes (Signed)
  Office: [phone]  /  Fax: [phone]    Date: December 05, 2022    Appointment Start Time: 8:31am Duration: 29 minutes Provider: Lawerance Cruel, Psy.D. Type of Session: Individual Therapy  Location of Patient: Home (private location) Location of Provider: Provider's Home (private office) Type of Contact: Telepsychological Visit via MyChart Video Visit  Session Content: Heather Lee is a 51 y.o. female presenting for a follow-up appointment to address the previously established treatment goal of increasing coping skills.Today's appointment was a telepsychological visit. Heather Lee provided verbal consent for today's telepsychological appointment and she is aware she is responsible for securing confidentiality on her end of the session. Prior to proceeding with today's appointment, Heather Lee's physical location at the time of this appointment was obtained as well a phone number she could be reached at in the event of technical difficulties. Heather Lee and this provider participated in today's telepsychological service.   This provider conducted a brief check-in. Heather Lee shared, "I'm doing okay." She discussed experiencing a "win and loss yesterday." Further explored and processed. She stated she lost weight and discussed celebrating with a piece of cake, noting the cake made her feel physically sick and she also experienced guilt. Further explored. This provider and Heather Lee explored other ways to celebrate weight loss. She agreed to try the following: play pickleball; go shopping with a budget (less than $20); host game night; and visit a friend. Moreover, psychoeducation provided regarding self-compassion. Heather Lee was engaged in a self-compassion exercise to help with eating-related challenges and other ongoing stressors. She was encouraged to regularly ask herself, "What do I need right now?" and "How can I care and comfort myself in this moment?" Moreover, Heather Lee denied  experiencing suicidal, self-injurious, and homicidal ideation, plan, or intent since the last appointment with this provider and described future plans that include ongoing efforts to improve her well-being and eating habits. Overall, Heather Lee was receptive to today's appointment as evidenced by openness to sharing, responsiveness to feedback, and willingness to work toward increasing self-compassion.  Mental Status Examination:  Appearance: neat Behavior: appropriate to circumstances Mood: neutral Affect: mood congruent Speech: WNL Eye Contact: appropriate Psychomotor Activity: WNL Gait: unable to assess Thought Process: linear, logical, and goal directed and denies suicidal, homicidal, and self-harm ideation, plan and intent  Thought Content/Perception: no hallucinations, delusions, bizarre thinking or behavior endorsed or observed Orientation: AAOx4 Memory/Concentration: memory, attention, language, and fund of knowledge intact  Insight: fair Judgment: fair  Interventions:  Conducted a brief chart review Conducted a risk assessment Provided empathic reflections and validation Provided positive reinforcement Employed supportive psychotherapy interventions to facilitate reduced distress and to improve coping skills with identified stressors Recommended/discussed options for longer-term therapeutic services Psychoeducation provided regarding self-compassion Engaged pt in a self-compassion exercise  DSM-5 Diagnosis(es):   F50.89 Other Specified Feeding or Eating Disorder, Emotional Eating Behaviors, F41.1 Generalized Anxiety Disorder, and F33.1  Major Depressive Disorder, Recurrent Episode, Moderate  Treatment Goal & Progress: During the initial appointment with this provider, the following treatment goal was established: increase coping skills. Heather Lee has continues to demonstrate willingness to engage in learned skill(s).  Plan: The next appointment is scheduled for 12/19/2022  at 2pm, which will be via MyChart Video Visit. The next session will focus on working towards the established treatment goal. Additionally, she provided verbal consent for this provider to place a referral with [facility].

## 2022-12-06 ENCOUNTER — Telehealth (INDEPENDENT_AMBULATORY_CARE_PROVIDER_SITE_OTHER): Payer: BC Managed Care – PPO | Admitting: Family Medicine

## 2022-12-19 ENCOUNTER — Telehealth (INDEPENDENT_AMBULATORY_CARE_PROVIDER_SITE_OTHER): Payer: BC Managed Care – PPO | Admitting: Psychology

## 2022-12-26 ENCOUNTER — Telehealth (INDEPENDENT_AMBULATORY_CARE_PROVIDER_SITE_OTHER): Payer: BC Managed Care – PPO | Admitting: Psychology

## 2022-12-26 DIAGNOSIS — F411 Generalized anxiety disorder: Secondary | ICD-10-CM

## 2022-12-26 DIAGNOSIS — F331 Major depressive disorder, recurrent, moderate: Secondary | ICD-10-CM | POA: Diagnosis not present

## 2022-12-26 DIAGNOSIS — F5089 Other specified eating disorder: Secondary | ICD-10-CM | POA: Diagnosis not present

## 2022-12-26 NOTE — Progress Notes (Signed)
  Office: [phone]  /  Fax: [phone]    Date: December 26, 2022    Appointment Start Time: 11:27am Duration: 23 minutes Provider: Glennie Isle, Psy.D. Type of Session: Individual Therapy  Location of Patient: Home (private location) Location of Provider: Provider's Home (private office) Type of Contact: Telepsychological Visit via MyChart Video Visit  Session Content: Heather Lee is a 52 y.o. female presenting for a follow-up appointment to address the previously established treatment goal of increasing coping skills.Today's appointment was a telepsychological visit. Heather Lee provided verbal consent for today's telepsychological appointment and she is aware she is responsible for securing confidentiality on her end of the session. Prior to proceeding with today's appointment, Heather Lee's physical location at the time of this appointment was obtained as well a phone number she could be reached at in the event of technical difficulties. Heather Lee and this provider participated in today's telepsychological service.   This provider conducted a brief check-in. Kanita reported "doing really well" with her eating habits during the holidays; however, she described being hard on herself when she deviated. Reviewed self-compassion. This provider and Heather Lee explored the story her mind tells her about her weight loss/eating habits that is stuck on repeat. Heather Lee of the appointment focused on re-writing a more compassionate version. Moreover, Heather Lee denied experiencing suicidal, self-injurious, and homicidal ideation, plan, or intent since the last appointment with this provider and described future plans that include ongoing efforts to improve her well-being and eating habits. Overall, Heather Lee was receptive to today's appointment as evidenced by openness to sharing, responsiveness to feedback, and willingness to implement discussed strategies .  Mental Status Examination:   Appearance: neat Behavior: appropriate to circumstances Mood: sad Affect: mood congruent Speech: WNL Eye Contact: appropriate Psychomotor Activity: WNL Gait: unable to assess Thought Process: linear, logical, and goal directed and denies suicidal, homicidal, and self-harm ideation, plan and intent  Thought Content/Perception: no hallucinations, delusions, bizarre thinking or behavior endorsed or observed Orientation: AAOx4 Memory/Concentration: memory, attention, language, and fund of knowledge intact  Insight: fair Judgment: fair  Interventions:  Conducted a brief chart review Conducted a risk assessment Provided empathic reflections and validation Reviewed content from the previous session Provided positive reinforcement Employed supportive psychotherapy interventions to facilitate reduced distress and to improve coping skills with identified stressors Engaged pt in a self-compassion exercise  DSM-5 Diagnosis(es):   F50.89 Other Specified Feeding or Eating Disorder, Emotional Eating Behaviors, F41.1 Generalized Anxiety Disorder, and F33.1  Major Depressive Disorder, Recurrent Episode, Moderate  Treatment Goal & Progress: During the initial appointment with this provider, the following treatment goal was established: increase coping skills. Heather Lee has demonstrated progress in her goal as evidenced by increased awareness of hunger patterns. Heather Lee also continues to demonstrate willingness to engage in learned skill(s).  Plan: The next appointment is scheduled for 01/08/2023 at 11am, which will be via MyChart Video Visit. The next session will focus on working towards the established treatment goal. Heather Lee stated she will complete paperwork and schedule with [facility] for an initial appointment.

## 2023-01-01 ENCOUNTER — Ambulatory Visit (INDEPENDENT_AMBULATORY_CARE_PROVIDER_SITE_OTHER): Payer: BC Managed Care – PPO | Admitting: Adult Health

## 2023-01-01 ENCOUNTER — Ambulatory Visit (INDEPENDENT_AMBULATORY_CARE_PROVIDER_SITE_OTHER): Payer: BC Managed Care – PPO | Admitting: Family Medicine

## 2023-01-01 ENCOUNTER — Encounter (INDEPENDENT_AMBULATORY_CARE_PROVIDER_SITE_OTHER): Payer: Self-pay | Admitting: Adult Health

## 2023-01-01 VITALS — BP 122/79 | HR 66 | Temp 98.7°F | Ht 67.0 in | Wt 235.0 lb

## 2023-01-01 DIAGNOSIS — E7849 Other hyperlipidemia: Secondary | ICD-10-CM | POA: Diagnosis not present

## 2023-01-01 DIAGNOSIS — R7303 Prediabetes: Secondary | ICD-10-CM

## 2023-01-01 DIAGNOSIS — E669 Obesity, unspecified: Secondary | ICD-10-CM | POA: Diagnosis not present

## 2023-01-01 DIAGNOSIS — E559 Vitamin D deficiency, unspecified: Secondary | ICD-10-CM

## 2023-01-01 DIAGNOSIS — Z6836 Body mass index (BMI) 36.0-36.9, adult: Secondary | ICD-10-CM

## 2023-01-08 ENCOUNTER — Telehealth (INDEPENDENT_AMBULATORY_CARE_PROVIDER_SITE_OTHER): Payer: BC Managed Care – PPO | Admitting: Psychology

## 2023-01-08 DIAGNOSIS — F411 Generalized anxiety disorder: Secondary | ICD-10-CM

## 2023-01-08 DIAGNOSIS — F331 Major depressive disorder, recurrent, moderate: Secondary | ICD-10-CM

## 2023-01-08 DIAGNOSIS — F5089 Other specified eating disorder: Secondary | ICD-10-CM | POA: Diagnosis not present

## 2023-01-08 NOTE — Progress Notes (Signed)
  Office: [phone]  /  Fax: [phone]    Date: January 08, 2023    Appointment Start Time: 11:00am Duration: 24 minutes Provider: Glennie Isle, Psy.D. Type of Session: Individual Therapy  Location of Patient: Home (private location) Location of Provider: Provider's Home (private office) Type of Contact: Telepsychological Visit via MyChart Video Visit  Session Content: Heather Lee is a 52 y.o. female presenting for a follow-up appointment to address the previously established treatment goal of increasing coping skills.Today's appointment was a telepsychological visit. Heather Lee provided verbal consent for today's telepsychological appointment and she is aware she is responsible for securing confidentiality on her end of the session. Prior to proceeding with today's appointment, Heather Lee's physical location at the time of this appointment was obtained as well a phone number she could be reached at in the event of technical difficulties. Heather Lee and this provider participated in today's telepsychological service.   This provider conducted a brief check-in. Heather Lee reported, "I fell off the wagon." She explained she was traveling for a wedding and described challenges with her eating habits during that time. Further explored and processed. Reviewed the previously developed self-compassion story regarding her journey. To help cope with unhelpful thinking patterns, she was engaged in thought challenging using the following the thought: "Never going to lose this weight." This provider helped Heather Lee develop a more balanced thought taking into account evidence for and against the aforementioned thought. Session then focused on helping her get back on track with her eating habits. Overall, Heather Lee was receptive to today's appointment as evidenced by openness to sharing, responsiveness to feedback, and willingness to implement discussed strategies .  Mental Status Examination:   Appearance: neat Behavior: appropriate to circumstances Mood: sad Affect: mood congruent Speech: WNL Eye Contact: appropriate Psychomotor Activity: WNL Gait: unable to assess Thought Process: linear, logical, and goal directed and denies suicidal, homicidal, and self-harm ideation, plan and intent  Thought Content/Perception: no hallucinations, delusions, bizarre thinking or behavior endorsed or observed Orientation: AAOx4 Memory/Concentration: memory, attention, language, and fund of knowledge intact  Insight: fair Judgment: fair  Interventions:  Conducted a brief chart review Conducted a risk assessment Provided empathic reflections and validation Reviewed content from the previous session Employed supportive psychotherapy interventions to facilitate reduced distress and to improve coping skills with identified stressors Engaged patient in thought challenging/cognitive restructuring   DSM-5 Diagnosis(es):   F50.89 Other Specified Feeding or Eating Disorder, Emotional Eating Behaviors, F41.1 Generalized Anxiety Disorder, and F33.1  Major Depressive Disorder, Recurrent Episode, Moderate  Treatment Goal & Progress: During the initial appointment with this provider, the following treatment goal was established: increase coping skills. Heather Lee has demonstrated progress in her goal as evidenced by increased awareness of hunger patterns. Heather Lee also continues to demonstrate willingness to engage in learned skill(s).  Plan: Due to recent challenges, the next appointment is scheduled for 01/15/2023 at 10am, which will be via MyChart Video Visit. The next session will focus on working towards the established treatment goal. Heather Lee has an initial appointment with [facility] on February 06, 2023.

## 2023-01-08 NOTE — Progress Notes (Signed)
Chief Complaint:   OBESITY Heather Lee is here to discuss her progress with her obesity treatment plan along with follow-up of her obesity related diagnoses. Heather Lee is on the Category 2 Plan and states she is following her eating plan approximately 75% of the time. [place] states she is walking 30 minutes 3 times per week.  Today's visit was #: 68 Starting weight: 24 LBS Starting date: 12/28/2020 Today's weight: 235 LBS Today's date: 01/01/2023 Total lbs lost to date: 0 Total lbs lost since last in-office visit: 4 LBS  Interim History:  Heather Lee feels that the category plans are too restrictive. She is "very discouraged" with the slow weight loss. She provided the following food recall that is typical of a day- Breakfast: Eggs, hot tea Lunch: Tuna with light mayonnaise, collard greens Dinner: Salmon with at least two vegetables  She has been reducing sweets and salty snacks.  Of Note- she reports being "overweight" for >25 years.  Subjective:   1. Pre-diabetes Patient has been off metformin for >6 months.   She denies family history of MENS 2 or MTC. She denies personal history of pancreatitis.  2. Other hyperlipidemia Lipid Panel     Component Value Date/Time   CHOL 175 01/02/2022 0922   TRIG 59 01/02/2022 0922   HDL 60 01/02/2022 0922   LDLCALC 104 (H) 01/02/2022 0922   LABVLDL 11 01/02/2022 0922    Family history diabetes/heart disease.  (Age 67) Father- chronic kidney disease, hypertension, CVA.   (Age 1) Mother- hypertension.  3. Vitamin D deficiency Patient currently taking OTC vitamin D3 5000 IU daily. She endorses stable energy level.  Assessment/Plan:   1. Pre-diabetes Check labs at next office visit.  2. Other hyperlipidemia Check labs at next office visit.  3. Vitamin D deficiency Check labs at next office visit.  4. Obesity, Current BMI 36.8 Heather Lee is currently in the action stage of change. As such, her goal is to  continue with weight loss efforts. She has agreed to keeping a food journal of lean meats and veggies.   Check fasting labs and IC at next OV. Pt aware to arrive 30 mins prior to scheduled OV.  Exercise goals:  As is.   Behavioral modification strategies: increasing lean protein intake, decreasing simple carbohydrates, meal planning and cooking strategies, better snacking choices, and planning for success.  Heather Lee has agreed to follow-up with our clinic in 4 weeks. She was informed of the importance of frequent follow-up visits to maximize her success with intensive lifestyle modifications for her multiple health conditions.   Objective:   Blood pressure 122/79, pulse 66, temperature 98.7 F (37.1 C), height 5\' 7"  (1.702 m), weight 235 lb (106.6 kg), last menstrual period 06/07/2018, SpO2 93 %. Body mass index is 36.81 kg/m.  General: Cooperative, alert, well developed, in no acute distress. HEENT: Conjunctivae and lids unremarkable. Cardiovascular: Regular rhythm.  Lungs: Normal work of breathing. Neurologic: No focal deficits.   Lab Results  Component Value Date   CREATININE 0.78 01/02/2022   BUN 8 01/02/2022   NA 140 01/02/2022   K 4.2 01/02/2022   CL 102 01/02/2022   CO2 26 01/02/2022   Lab Results  Component Value Date   ALT 21 01/02/2022   AST 22 01/02/2022   ALKPHOS 71 01/02/2022   BILITOT 0.5 01/02/2022   Lab Results  Component Value Date   HGBA1C 5.5 01/02/2022   HGBA1C 5.7 (H) 07/21/2021   HGBA1C 5.7 (H) 12/28/2020   Lab  Results  Component Value Date   INSULIN 36.4 (H) 01/02/2022   INSULIN 32.9 (H) 07/21/2021   INSULIN 30.4 (H) 12/28/2020   Lab Results  Component Value Date   TSH 1.770 12/28/2020   Lab Results  Component Value Date   CHOL 175 01/02/2022   HDL 60 01/02/2022   LDLCALC 104 (H) 01/02/2022   TRIG 59 01/02/2022   Lab Results  Component Value Date   VD25OH 53.3 01/02/2022   VD25OH 43.4 07/21/2021   VD25OH 47.0 05/16/2021    Lab Results  Component Value Date   WBC 5.2 12/28/2020   HGB 14.2 12/28/2020   HCT 43.2 12/28/2020   MCV 82 12/28/2020   PLT 375 12/28/2020   No results found for: "IRON", "TIBC", "FERRITIN"  Attestation Statements:   Reviewed by clinician on day of visit: allergies, medications, problem list, medical history, surgical history, family history, social history, and previous encounter notes.  I, Davy Pique, RMA, am acting as Location manager for Mina Marble, NP.  I have reviewed the above documentation for accuracy and completeness, and I agree with the above. -  [place] d. Chelsy Parrales, NP-C

## 2023-01-15 ENCOUNTER — Telehealth (INDEPENDENT_AMBULATORY_CARE_PROVIDER_SITE_OTHER): Payer: BC Managed Care – PPO | Admitting: Psychology

## 2023-01-15 DIAGNOSIS — F331 Major depressive disorder, recurrent, moderate: Secondary | ICD-10-CM | POA: Diagnosis not present

## 2023-01-15 DIAGNOSIS — F5089 Other specified eating disorder: Secondary | ICD-10-CM

## 2023-01-15 DIAGNOSIS — F411 Generalized anxiety disorder: Secondary | ICD-10-CM

## 2023-01-15 NOTE — Progress Notes (Signed)
  Office: [phone]  /  Fax: [phone]    Date: January 15, 2023    Appointment Start Time: 9:59am Duration: 27 minutes Provider: Glennie Isle, Psy.D. Type of Session: Individual Therapy  Location of Patient: Home (private location) Location of Provider: Provider's Home (private office) Type of Contact: Telepsychological Visit via MyChart Video Visit  Session Content: Heather Lee is a 52 y.o. female presenting for a follow-up appointment to address the previously established treatment goal of increasing coping skills.Today's appointment was a telepsychological visit. Heather Lee provided verbal consent for today's telepsychological appointment and she is aware she is responsible for securing confidentiality on her end of the session. Prior to proceeding with today's appointment, Heather Lee's physical location at the time of this appointment was obtained as well a phone number she could be reached at in the event of technical difficulties. Heather Lee and this provider participated in today's telepsychological service.   This provider conducted a brief check-in. Heather Lee discussed she has given herself a "break to take the pressure off," adding she has "re-committed" to intermittent fasting [facility]. Associated thoughts and feelings were processed. She added, "I feel better." Heather Lee discussed experiencing challenges with consistency as it relates to her eating habits. Further explored and processed. Psychoeducation regarding triggers for emotional eating was provided. Heather Lee was provided a handout, and encouraged to utilize the handout between now and the next appointment to increase awareness of triggers and frequency. Heather Lee agreed. This provider also discussed behavioral strategies for specific triggers, such as placing the utensil down when conversing to avoid mindless eating. Heather Lee provided verbal consent during today's  appointment for this provider to send a handout about triggers via e-mail. Overall, Heather Lee was receptive to today's appointment as evidenced by openness to sharing, responsiveness to feedback, and willingness to explore triggers for emotional eating.  Mental Status Examination:  Appearance: neat Behavior: appropriate to circumstances Mood: sad Affect: mood congruent; tearful at times Speech: WNL Eye Contact: appropriate Psychomotor Activity: WNL Gait: unable to assess Thought Process: linear, logical, and goal directed and no evidence or endorsement of suicidal, homicidal, and self-harm ideation, plan and intent  Thought Content/Perception: no hallucinations, delusions, bizarre thinking or behavior endorsed or observed Orientation: AAOx4 Memory/Concentration: memory, attention, language, and fund of knowledge intact  Insight: fair Judgment: fair  Interventions:  Conducted a brief chart review Provided empathic reflections and validation Reviewed content from the previous session Provided positive reinforcement Employed supportive psychotherapy interventions to facilitate reduced distress and to improve coping skills with identified stressors Psychoeducation provided regarding triggers for emotional eating behaviors  DSM-5 Diagnosis(es):  F50.89 Other Specified Feeding or Eating Disorder, Emotional Eating Behaviors, F41.1 Generalized Anxiety Disorder, and F33.1  Major Depressive Disorder, Recurrent Episode, Moderate  Treatment Goal & Progress: During the initial appointment with this provider, the following treatment goal was established: increase coping skills. Heather Lee has demonstrated progress in her goal as evidenced by increased awareness of hunger patterns. Heather Lee also continues to demonstrate willingness to engage in learned skill(s).  Plan: The next appointment is scheduled for 01/29/2023 at 10am, which will be via MyChart Video Visit. The next session will focus on  working towards the established treatment goal. Heather Lee has an initial appointment with [facility] on February 06, 2023.

## 2023-01-18 ENCOUNTER — Ambulatory Visit (INDEPENDENT_AMBULATORY_CARE_PROVIDER_SITE_OTHER): Payer: BC Managed Care – PPO | Admitting: Adult Health

## 2023-01-29 ENCOUNTER — Telehealth (INDEPENDENT_AMBULATORY_CARE_PROVIDER_SITE_OTHER): Payer: BC Managed Care – PPO | Admitting: Psychology

## 2023-02-06 ENCOUNTER — Ambulatory Visit: Payer: BC Managed Care – PPO | Admitting: Clinical

## 2023-02-13 ENCOUNTER — Encounter (INDEPENDENT_AMBULATORY_CARE_PROVIDER_SITE_OTHER): Payer: Self-pay | Admitting: Adult Health

## 2023-02-14 ENCOUNTER — Ambulatory Visit (INDEPENDENT_AMBULATORY_CARE_PROVIDER_SITE_OTHER): Payer: BC Managed Care – PPO | Admitting: Adult Health

## 2023-03-12 ENCOUNTER — Other Ambulatory Visit: Payer: Self-pay | Admitting: Obstetrics and Gynecology

## 2023-03-12 DIAGNOSIS — N6320 Unspecified lump in the left breast, unspecified quadrant: Secondary | ICD-10-CM

## 2023-03-22 ENCOUNTER — Ambulatory Visit: Payer: BC Managed Care – PPO

## 2023-03-22 ENCOUNTER — Ambulatory Visit
Admission: RE | Admit: 2023-03-22 | Discharge: 2023-03-22 | Disposition: A | Discharge status: Home / Self Care | Payer: BC Managed Care – PPO | Source: Ambulatory Visit | Attending: Obstetrics and Gynecology | Admitting: Obstetrics and Gynecology

## 2023-03-22 DIAGNOSIS — N6320 Unspecified lump in the left breast, unspecified quadrant: Secondary | ICD-10-CM

## 2023-05-10 ENCOUNTER — Ambulatory Visit (INDEPENDENT_AMBULATORY_CARE_PROVIDER_SITE_OTHER): Payer: BC Managed Care – PPO | Admitting: Adult Health

## 2023-05-16 ENCOUNTER — Ambulatory Visit (INDEPENDENT_AMBULATORY_CARE_PROVIDER_SITE_OTHER): Payer: BC Managed Care – PPO | Admitting: Adult Health

## 2023-05-16 ENCOUNTER — Encounter (INDEPENDENT_AMBULATORY_CARE_PROVIDER_SITE_OTHER): Payer: Self-pay | Admitting: Adult Health

## 2023-05-16 VITALS — BP 111/74 | HR 74 | Temp 98.8°F | Ht 67.0 in | Wt 241.0 lb

## 2023-05-16 DIAGNOSIS — E559 Vitamin D deficiency, unspecified: Secondary | ICD-10-CM

## 2023-05-16 DIAGNOSIS — E7849 Other hyperlipidemia: Secondary | ICD-10-CM | POA: Diagnosis not present

## 2023-05-16 DIAGNOSIS — E669 Obesity, unspecified: Secondary | ICD-10-CM | POA: Diagnosis not present

## 2023-05-16 DIAGNOSIS — Z6837 Body mass index (BMI) 37.0-37.9, adult: Secondary | ICD-10-CM | POA: Diagnosis not present

## 2023-05-16 NOTE — Progress Notes (Addendum)
WEIGHT SUMMARY AND BIOMETRICS  Vitals Temp: 98.8 F (37.1 C) BP: 111/74 Pulse Rate: 74 SpO2: 97 %   Anthropometric Measurements Height: 5\' 7"  (1.702 m) Weight: 241 lb (109.3 kg) BMI (Calculated): 37.74 Weight at Last Visit: 235 lb Weight Lost Since Last Visit: 0 Weight Gained Since Last Visit: 6 lb Starting Weight: 232 lb Total Weight Loss (lbs): 0 lb (0 kg) Peak Weight: 241 lb   Body Composition  Body Fat %: 42.4 % Fat Mass (lbs): 102.2 lbs Muscle Mass (lbs): 132 lbs Total Body Water (lbs): 83.2 lbs Visceral Fat Rating : 12   Other Clinical Data Fasting: no Labs: no Today's Visit #: 20 Starting Date: 12/28/20    Chief Complaint:   OBESITY Heather Lee is here to discuss her progress with her obesity treatment plan. She is not following any prescribed eating plan. She states she is exercising Aquatic Therapy 45 minutes 3 times per week: to tx LLE pain.   Interim History:  Last OV with [facility] on 01/01/2023- lost to follow-up.  Hunger/appetite-she endorses polyphagia throughout the day.  Last dose of Metformin 500mg  Jan 2024. She is interested on GLP-1 ot GIP/GLP-1 therapy. She denies family hx of MEN 2 or MTC.  She denies personal hx of pancreatitis.  Sleep- she is using nightly CPAP and states "I hate that machine".  Exercise-to treat LLE pain, she started Aquatic therapy 3 x week for the last 2 weeks  Reviewed Bioimpedance results with pt: Muscle Mass: + 2.2 lbs Adipose Mass: + 3.6 lbs Visceral Adipose Rating remained stable at 12  Subjective:   1. Vitamin D deficiency  Latest Reference Range & Units 12/28/20 10:36 05/16/21 09:06 07/21/21 08:09 01/02/22 09:22  Vitamin D, 25-Hydroxy 30.0 - 100.0 ng/mL 30.5 47.0 43.4 53.3    2. Other hyperlipidemia Lipid Panel     Component Value Date/Time   CHOL 175 01/02/2022 0922   TRIG 59 01/02/2022 0922   HDL 60 01/02/2022 0922   LDLCALC 104 (H) 01/02/2022 0922   LABVLDL 11 01/02/2022 0922   The 10-year  ASCVD risk score (Arnett DK, et al., 2019) is: 2.2%   Values used to calculate the score:     Age: 73 years     Sex: Female     Is Non-Hispanic African American: Yes     Diabetic: No     Tobacco smoker: No     Systolic Blood Pressure: 111 mmHg     Is BP treated: Yes     HDL Cholesterol: 60 mg/dL     Total Cholesterol: 175 mg/dL    Assessment/Plan:   1. Vitamin D deficiency Check Labs at next OV  2. Other hyperlipidemia Check Labs at next OV  3. Obesity, Current BMI 37.8  Heather Lee is not currently in the action stage of change. As such, her goal is to get back to weightloss efforts . She has agreed to the Category 2 Plan.   Exercise goals: For substantial health benefits, adults should do at least 150 minutes (2 hours and 30 minutes) a week of moderate-intensity, or 75 minutes (1 hour and 15 minutes) a week of vigorous-intensity aerobic physical activity, or an equivalent combination of moderate- and vigorous-intensity aerobic activity. Aerobic activity should be performed in episodes of at least 10 minutes, and preferably, it should be spread throughout the week.  Behavioral modification strategies: increasing lean protein intake, decreasing simple carbohydrates, increasing vegetables, increasing water intake, no skipping meals, meal planning and cooking strategies, keeping healthy  foods in the home, ways to avoid boredom eating, and planning for success.  Ramah has agreed to follow-up with our clinic in 1 weeks. She was informed of the importance of frequent follow-up visits to maximize her success with intensive lifestyle modifications for her multiple health conditions.   Check Fasting Labs and IC at next OV.  Pt aware to arrive fasting and 30 mins early at next OV.  Objective:   Blood pressure 111/74, pulse 74, temperature 98.8 F (37.1 C), height 5\' 7"  (1.702 m), weight 241 lb (109.3 kg), last menstrual period 06/07/2018, SpO2 97 %. Body mass index is 37.75  kg/m.  General: Cooperative, alert, well developed, in no acute distress. HEENT: Conjunctivae and lids unremarkable. Cardiovascular: Regular rhythm.  Lungs: Normal work of breathing. Neurologic: No focal deficits.   Lab Results  Component Value Date   CREATININE 0.78 01/02/2022   BUN 8 01/02/2022   NA 140 01/02/2022   K 4.2 01/02/2022   CL 102 01/02/2022   CO2 26 01/02/2022   Lab Results  Component Value Date   ALT 21 01/02/2022   AST 22 01/02/2022   ALKPHOS 71 01/02/2022   BILITOT 0.5 01/02/2022   Lab Results  Component Value Date   HGBA1C 5.5 01/02/2022   HGBA1C 5.7 (H) 07/21/2021   HGBA1C 5.7 (H) 12/28/2020   Lab Results  Component Value Date   INSULIN 36.4 (H) 01/02/2022   INSULIN 32.9 (H) 07/21/2021   INSULIN 30.4 (H) 12/28/2020   Lab Results  Component Value Date   TSH 1.770 12/28/2020   Lab Results  Component Value Date   CHOL 175 01/02/2022   HDL 60 01/02/2022   LDLCALC 104 (H) 01/02/2022   TRIG 59 01/02/2022   Lab Results  Component Value Date   VD25OH 53.3 01/02/2022   VD25OH 43.4 07/21/2021   VD25OH 47.0 05/16/2021   Lab Results  Component Value Date   WBC 5.2 12/28/2020   HGB 14.2 12/28/2020   HCT 43.2 12/28/2020   MCV 82 12/28/2020   PLT 375 12/28/2020   No results found for: "IRON", "TIBC", "FERRITIN"  Attestation Statements:   Reviewed by clinician on day of visit: allergies, medications, problem list, medical history, surgical history, family history, social history, and previous encounter notes.  Time spent on visit including pre-visit chart review and post-visit care and charting was 28 minutes.   I have reviewed the above documentation for accuracy and completeness, and I agree with the above. -  Heather Lee d. Sankalp Ferrell, NP-C

## 2023-05-22 ENCOUNTER — Encounter (INDEPENDENT_AMBULATORY_CARE_PROVIDER_SITE_OTHER): Payer: Self-pay | Admitting: Physician Assistant

## 2023-05-22 ENCOUNTER — Ambulatory Visit (INDEPENDENT_AMBULATORY_CARE_PROVIDER_SITE_OTHER): Payer: BC Managed Care – PPO | Admitting: Physician Assistant

## 2023-05-22 VITALS — BP 112/71 | HR 71 | Temp 98.2°F | Ht 67.0 in | Wt 238.0 lb

## 2023-05-22 DIAGNOSIS — E559 Vitamin D deficiency, unspecified: Secondary | ICD-10-CM

## 2023-05-22 DIAGNOSIS — E7849 Other hyperlipidemia: Secondary | ICD-10-CM | POA: Diagnosis not present

## 2023-05-22 DIAGNOSIS — R7303 Prediabetes: Secondary | ICD-10-CM

## 2023-05-22 DIAGNOSIS — I10 Essential (primary) hypertension: Secondary | ICD-10-CM | POA: Diagnosis not present

## 2023-05-22 DIAGNOSIS — Z6837 Body mass index (BMI) 37.0-37.9, adult: Secondary | ICD-10-CM

## 2023-05-22 DIAGNOSIS — E669 Obesity, unspecified: Secondary | ICD-10-CM | POA: Insufficient documentation

## 2023-05-22 DIAGNOSIS — R5383 Other fatigue: Secondary | ICD-10-CM | POA: Insufficient documentation

## 2023-05-22 NOTE — Progress Notes (Signed)
.smr  Office: [phone]  /  Fax: [phone]  WEIGHT SUMMARY AND BIOMETRICS  Vitals Temp: 98.2 F (36.8 C) BP: 112/71 Pulse Rate: 71 SpO2: 98 %   Anthropometric Measurements Height: 5\' 7"  (1.702 m) Weight: 238 lb (108 kg) BMI (Calculated): 37.27 Weight at Last Visit: 241 lb Weight Lost Since Last Visit: 3 lb Starting Weight: 232 lb   Body Composition  Body Fat %: 41.3 % Fat Mass (lbs): 98.4 lbs Muscle Mass (lbs): 133 lbs Total Body Water (lbs): 79 lbs Visceral Fat Rating : 12   Other Clinical Data Fasting: Yes Labs: Yes Today's Visit #: 21 Starting Date: 12/28/20     HPI  Chief Complaint: OBESITY  Heather Lee is here to discuss her progress with her obesity treatment plan. She is on the the Category 2 Plan and states she is following her eating plan approximately 50 % of the time. She states she is exercising/[employer] 60 minutes 3 times per week.   Interval History:  Since last office visit she is down 3 lbs.  Hunger/appetite-well controlled Cravings- Working on controlling sugar cravings and doing well for most part Stress- 7 out of 10. Is hopeful that she will be able to move jobs to [employer] as she is traveling to [place] 5 days weekly and is tired of all the driving/stress related to this.  Sleep- Better overall since starting CPAP, but only wearing 4 hrs nightly. She plans to discuss options for nasal CPAP, etc  with provider.  [facility] and working on strengthening and cardio too Hydration-Drinking at least 64 oz water daily.   LABS today, but was unable to complete fasting IC  as did not arrive in time.  She was informed we would discuss her lab results at her next visit unless there is a critical issue that needs to be addressed sooner. She agreed to keep her next visit at the agreed upon time to discuss these results.  She was informed we would discuss her lab results at her next visit unless there is a  critical issue that needs to be addressed sooner. She agreed to keep her next visit at the agreed upon time to discuss these results.    Pharmacotherapy: None for weight loss.   TREATMENT PLAN FOR OBESITY:  Recommended Dietary Goals  Raegen is currently in the action stage of change. As such, her goal is to continue weight management plan. She has agreed to the Category 2 Plan.  Behavioral Intervention  We discussed the following Behavioral Modification Strategies today: increasing lean protein intake, decreasing simple carbohydrates , increasing vegetables, increasing lower glycemic fruits, increasing fiber rich foods, avoiding skipping meals, increasing water intake, continue to practice mindfulness when eating, and planning for success.  Additional resources provided today: NA  Recommended Physical Activity Goals  Elara has been advised to work up to 150 minutes of moderate intensity aerobic activity a week and strengthening exercises 2-3 times per week for cardiovascular health, weight loss maintenance and preservation of muscle mass.   She has agreed to Continue current level of physical activity  and Think about ways to increase daily physical activity and overcoming barriers to exercise   Pharmacotherapy We discussed various medication options to help Kabria with her weight loss efforts and we both agreed to continue to work on nutritional and behavioral strategies to promote weight loss.     Return in about 2 weeks (around 06/05/2023) for Fasting IC.Marland Kitchen She was informed of  the importance of frequent follow up visits to maximize her success with intensive lifestyle modifications for her multiple health conditions.  PHYSICAL EXAM:  Blood pressure 112/71, pulse 71, temperature 98.2 F (36.8 C), height 5\' 7"  (1.702 m), weight 238 lb (108 kg), last menstrual period 06/07/2018, SpO2 98 %. Body mass index is 37.28 kg/m.  General: She is overweight, cooperative, alert,  well developed, and in no acute distress. PSYCH: Has normal mood, affect and thought process.   Cardiovascular: HR 70's regular Lungs: Normal breathing effort, no conversational dyspnea. Neuro: no focal deficits  DIAGNOSTIC DATA REVIEWED:  BMET    Component Value Date/Time   NA 140 01/02/2022 0922   K 4.2 01/02/2022 0922   CL 102 01/02/2022 0922   CO2 26 01/02/2022 0922   GLUCOSE 95 01/02/2022 0922   GLUCOSE 78 05/27/2021 1500   BUN 8 01/02/2022 0922   CREATININE 0.78 01/02/2022 0922   CALCIUM 9.2 01/02/2022 0922   GFRNONAA >60 05/27/2021 1500   GFRAA 89 12/28/2020 1036   Lab Results  Component Value Date   HGBA1C 5.5 01/02/2022   HGBA1C 5.7 (H) 12/28/2020   Lab Results  Component Value Date   INSULIN 36.4 (H) 01/02/2022   INSULIN 30.4 (H) 12/28/2020   Lab Results  Component Value Date   TSH 1.770 12/28/2020   CBC    Component Value Date/Time   WBC 5.2 12/28/2020 1036   RBC 5.30 (H) 12/28/2020 1036   HGB 14.2 12/28/2020 1036   HCT 43.2 12/28/2020 1036   PLT 375 12/28/2020 1036   MCV 82 12/28/2020 1036   MCH 26.8 12/28/2020 1036   MCHC 32.9 12/28/2020 1036   RDW 12.7 12/28/2020 1036   Iron Studies No results found for: "IRON", "TIBC", "FERRITIN", "IRONPCTSAT" Lipid Panel     Component Value Date/Time   CHOL 175 01/02/2022 0922   TRIG 59 01/02/2022 0922   HDL 60 01/02/2022 0922   LDLCALC 104 (H) 01/02/2022 0922   Hepatic Function Panel     Component Value Date/Time   PROT 6.9 01/02/2022 0922   ALBUMIN 4.3 01/02/2022 0922   AST 22 01/02/2022 0922   ALT 21 01/02/2022 0922   ALKPHOS 71 01/02/2022 0922   BILITOT 0.5 01/02/2022 0922      Component Value Date/Time   TSH 1.770 12/28/2020 1036   Nutritional Lab Results  Component Value Date   VD25OH 53.3 01/02/2022   VD25OH 43.4 07/21/2021   VD25OH 47.0 05/16/2021    ASSOCIATED CONDITIONS ADDRESSED TODAY  ASSESSMENT AND PLAN  Problem List Items Addressed This Visit     Essential  hypertension   Vitamin D deficiency   Relevant Orders   VITAMIN D 25 Hydroxy (Vit-D Deficiency, Fractures)   Pre-diabetes - Primary   Relevant Orders   CMP14+EGFR   Hemoglobin A1c   Insulin, random   Other hyperlipidemia   Relevant Orders   Lipid Panel With LDL/HDL Ratio   Fatigue   Relevant Orders   Vitamin B12   CBC with Differential/Platelet   TSH   Obesity- Start BMI 36.34 12/26/20   BMI 37.0-37.9, adult Current BMI 37.3   Prediabetes Last A1c was 5.5- at goal. Insulin 36.4- not at goal.   Medication(s): None Previously on metformin.  Interested in GLP/GIP medications. Patient denies a personal or family history of pancreatitis, medullary thyroid carcinoma or multiple endocrine neoplasia type II.   Polyphagia:Yes Lab Results  Component Value Date   HGBA1C 5.5 01/02/2022   HGBA1C 5.7 (H) 07/21/2021  HGBA1C 5.7 (H) 12/28/2020   Lab Results  Component Value Date   INSULIN 36.4 (H) 01/02/2022   INSULIN 32.9 (H) 07/21/2021   INSULIN 30.4 (H) 12/28/2020    Plan:  Recheck fasting labs today. Previously taking metformin, none since Jan . 2024.  She is interested in GLP/GIP medications. Will discuss medications further following follow up labs at next visit. Would like to repeat fasting IC as well and she will try to do at next visit.   Hypertension Hypertension well controlled, asymptomatic, and no significant medication side effects noted.  Medication(s): olmestartan 20 mg daily     BP Readings from Last 3 Encounters:  05/22/23 112/71  05/16/23 111/74  01/01/23 122/79   Lab Results  Component Value Date   CREATININE 0.78 01/02/2022   CREATININE 0.76 07/21/2021   CREATININE 0.97 05/27/2021   No results found for: "GFR"  Plan: Continue all antihypertensives at current dosages. Continue to work on nutrition plan to promote weight loss and improve BP control.    Vitamin D Deficiency Vitamin D is at goal of 50.  Most recent vitamin D level was 53.3. She is  on no vitamin D supplementation currently. Taking MVI only. Lab Results  Component Value Date   VD25OH 53.3 01/02/2022   VD25OH 43.4 07/21/2021   VD25OH 47.0 05/16/2021    Plan:  Recheck vitamin D level today and supplement if indicated.  Low vitamin D levels can be associated with adiposity and may result in leptin resistance and weight gain. Also associated with fatigue. Currently on vitamin D supplementation without any adverse effects.    Hyperlipidemia LDL is not at goal. HDL 60- at goal. Triglyceride 59 at goal.  Medication(s): None Cardiovascular risk factors: dyslipidemia, hypertension, and obesity (BMI >= 30 kg/m2)  Lab Results  Component Value Date   CHOL 175 01/02/2022   HDL 60 01/02/2022   LDLCALC 104 (H) 01/02/2022   TRIG 59 01/02/2022   Lab Results  Component Value Date   ALT 21 01/02/2022   AST 22 01/02/2022   ALKPHOS 71 01/02/2022   BILITOT 0.5 01/02/2022   The 10-year ASCVD risk score (Arnett DK, et al., 2019) is: 2.3%   Values used to calculate the score:     Age: 79 years     Sex: Female     Is Non-Hispanic African American: Yes     Diabetic: No     Tobacco smoker: No     Systolic Blood Pressure: 112 mmHg     Is BP treated: Yes     HDL Cholesterol: 60 mg/dL     Total Cholesterol: 175 mg/dL  Plan: Recheck fasting lipid panel today.  Continue to work on nutrition plan -decreasing simple carbohydrates, increasing lean proteins, decreasing saturated fats and cholesterol , avoiding trans fats and exercise as able to promote weight loss, improve lipids and decrease cardiovascular risks.   Fatigue:  Endorses Fatigue. No recent CBC, B12, Vitamin D or TSH levels. Plan : Recheck CBC, B12, Vitamin D and TSH levels. Supplement accordingly.    ATTESTASTION STATEMENTS:  Reviewed by clinician on day of visit: allergies, medications, problem list, medical history, surgical history, family history, social history, and previous encounter notes.   I have  personally spent 38 minutes total time today in preparation, patient care, nutritional counseling and documentation for this visit, including the following: review of clinical lab tests; review of medical tests/procedures/services.      Rayden Dock, PA-C

## 2023-05-23 LAB — CBC WITH DIFFERENTIAL/PLATELET
Basophils Absolute: 0 10*3/uL (ref 0.0–0.2)
Basos: 0 %
EOS (ABSOLUTE): 0.2 10*3/uL (ref 0.0–0.4)
Eos: 3 %
Hematocrit: 42.2 % (ref 34.0–46.6)
Hemoglobin: 13.7 g/dL (ref 11.1–15.9)
Immature Grans (Abs): 0 10*3/uL (ref 0.0–0.1)
Immature Granulocytes: 0 %
Lymphocytes Absolute: 1.8 10*3/uL (ref 0.7–3.1)
Lymphs: 41 %
MCH: 26.8 pg (ref 26.6–33.0)
MCHC: 32.5 g/dL (ref 31.5–35.7)
MCV: 83 fL (ref 79–97)
Monocytes Absolute: 0.4 10*3/uL (ref 0.1–0.9)
Monocytes: 9 %
Neutrophils Absolute: 2.1 10*3/uL (ref 1.4–7.0)
Neutrophils: 47 %
Platelets: 337 10*3/uL (ref 150–450)
RBC: 5.11 x10E6/uL (ref 3.77–5.28)
RDW: 13.3 % (ref 11.7–15.4)
WBC: 4.5 10*3/uL (ref 3.4–10.8)

## 2023-05-23 LAB — HEMOGLOBIN A1C
Est. average glucose Bld gHb Est-mCnc: 117 mg/dL
Hgb A1c MFr Bld: 5.7 % — ABNORMAL HIGH (ref 4.8–5.6)

## 2023-05-23 LAB — CMP14+EGFR
ALT: 21 IU/L (ref 0–32)
AST: 20 IU/L (ref 0–40)
Albumin/Globulin Ratio: 1.5 (ref 1.2–2.2)
Albumin: 4.1 g/dL (ref 3.8–4.9)
Alkaline Phosphatase: 61 IU/L (ref 44–121)
BUN/Creatinine Ratio: 10 (ref 9–23)
BUN: 9 mg/dL (ref 6–24)
Bilirubin Total: 0.5 mg/dL (ref 0.0–1.2)
CO2: 24 mmol/L (ref 20–29)
Calcium: 9.6 mg/dL (ref 8.7–10.2)
Chloride: 102 mmol/L (ref 96–106)
Creatinine, Ser: 0.89 mg/dL (ref 0.57–1.00)
Globulin, Total: 2.8 g/dL (ref 1.5–4.5)
Glucose: 100 mg/dL — ABNORMAL HIGH (ref 70–99)
Potassium: 4.3 mmol/L (ref 3.5–5.2)
Sodium: 140 mmol/L (ref 134–144)
Total Protein: 6.9 g/dL (ref 6.0–8.5)
eGFR: 77 mL/min/{1.73_m2} (ref >59)

## 2023-05-23 LAB — TSH: TSH: 1.65 u[IU]/mL (ref 0.450–4.500)

## 2023-05-23 LAB — VITAMIN B12: Vitamin B-12: 1065 pg/mL (ref 232–1245)

## 2023-05-23 LAB — VITAMIN D 25 HYDROXY (VIT D DEFICIENCY, FRACTURES): Vit D, 25-Hydroxy: 61.9 ng/mL (ref 30.0–100.0)

## 2023-05-23 LAB — LIPID PANEL WITH LDL/HDL RATIO
Cholesterol, Total: 163 mg/dL (ref 100–199)
HDL: 60 mg/dL (ref >39)
LDL Chol Calc (NIH): 94 mg/dL (ref 0–99)
LDL/HDL Ratio: 1.6 ratio (ref 0.0–3.2)
Triglycerides: 41 mg/dL (ref 0–149)
VLDL Cholesterol Cal: 9 mg/dL (ref 5–40)

## 2023-05-23 LAB — INSULIN, RANDOM: INSULIN: 40.6 u[IU]/mL — ABNORMAL HIGH (ref 2.6–24.9)

## 2023-06-11 ENCOUNTER — Ambulatory Visit (INDEPENDENT_AMBULATORY_CARE_PROVIDER_SITE_OTHER): Payer: BC Managed Care – PPO | Admitting: Family Medicine

## 2023-06-11 ENCOUNTER — Encounter (INDEPENDENT_AMBULATORY_CARE_PROVIDER_SITE_OTHER): Payer: Self-pay | Admitting: Family Medicine

## 2023-06-11 VITALS — BP 112/79 | HR 72 | Temp 98.2°F | Ht 67.0 in | Wt 242.0 lb

## 2023-06-11 DIAGNOSIS — E559 Vitamin D deficiency, unspecified: Secondary | ICD-10-CM | POA: Diagnosis not present

## 2023-06-11 DIAGNOSIS — E7849 Other hyperlipidemia: Secondary | ICD-10-CM

## 2023-06-11 DIAGNOSIS — R0602 Shortness of breath: Secondary | ICD-10-CM | POA: Diagnosis not present

## 2023-06-11 DIAGNOSIS — Z6837 Body mass index (BMI) 37.0-37.9, adult: Secondary | ICD-10-CM

## 2023-06-11 DIAGNOSIS — R7303 Prediabetes: Secondary | ICD-10-CM

## 2023-06-11 MED ORDER — METFORMIN HCL 500 MG PO TABS
ORAL_TABLET | ORAL | 0 refills | Status: DC
Start: 2023-06-11 — End: 2023-12-18

## 2023-06-11 NOTE — Progress Notes (Signed)
Carlye Grippe, D.O.  ABFM, ABOM Specializing in Clinical Bariatric Medicine  Office located at: [address] [place], [place]  [address]     Assessment and Plan:   Meds ordered this encounter  Medications   metFORMIN (GLUCOPHAGE) 500 MG tablet    Sig: 1 po with lunch daily    Dispense:  30 tablet    Refill:  0    30 d supply;  ** OV for RF **   Do not send RF request     SOB (shortness of breath) on exertion Assessment: Her resting energy expenditure slightly worsened from 1605 calories on 12/28/20 to 1584 calories today (06/11/23)     Plan: Switch pt from the Category 2 meal to the Category 1 meal plan.    Counseling done with patient regarding repeat I.C. results.  It is slightly worse then expected  Reviewed aspects of diet and lifestyle today with patient that will increase metabolism.  All questions addressed.  Continue to focus on lean proteins, adequate nutrition and advancing cardiovascular and resistance training as tolerated.     Pre-diabetes Assessment: Condition is worsening. Labs below indicate: Her A1c has worsened from 5.5 on 01/02/22 to 5.7 on 05/22/23. Her Insulin has also worsened slightly from 36.4 on 01/02/2022 to 40.6 on 05/22/23. Furthermore, labs suggest that her kidney, liver, and thryoid function are stable. Blood counts are not indicative of anemia. Her B12 levels are within the recommended limits of 500+   Heather Lee endorses having carb cravings. She was on Metformin in the past, but the medication was stopped because she was lost to follow up. When taking the medication, she endorses tolerating it well.   Lab Results  Component Value Date   HGBA1C 5.7 (H) 05/22/2023   HGBA1C 5.5 01/02/2022   HGBA1C 5.7 (H) 07/21/2021   INSULIN 40.6 (H) 05/22/2023   INSULIN 36.4 (H) 01/02/2022   INSULIN 32.9 (H) 07/21/2021   Lab Results  Component Value Date   CREATININE 0.89 05/22/2023   BUN 9 05/22/2023   NA 140 05/22/2023   K 4.3 05/22/2023   CL  102 05/22/2023   CO2 24 05/22/2023      Component Value Date/Time   PROT 6.9 05/22/2023 0820   ALBUMIN 4.1 05/22/2023 0820   AST 20 05/22/2023 0820   ALT 21 05/22/2023 0820   ALKPHOS 61 05/22/2023 0820   BILITOT 0.5 05/22/2023 0820   Lab Results  Component Value Date   TSH 1.650 05/22/2023    Lab Results  Component Value Date   WBC 4.5 05/22/2023   HGB 13.7 05/22/2023   HCT 42.2 05/22/2023   MCV 83 05/22/2023   PLT 337 05/22/2023   Lab Results  Component Value Date   VITAMINB12 1,065 05/22/2023     Plan: Restart Metformin 500 mg with lunch daily. Start with 1/2 tab for 3-4 days and if tolerating increase to 1 full tablet. Dicussed anticipated benefits and possible side effects of restarting medication. All questions were answered appropriately.   Encouraged patient to decrease simple carbs/ sugars; increase fiber and proteins -> follow her meal plan to improve her insulin and A1c levels. Heather Lee will continue to work on weight loss, exercise, via their meal plan we devised to help decrease the risk of progressing to diabetes. We will recheck A1c and fasting insulin level in approximately 3 months from last check, or as deemed appropriate.     Vitamin D deficiency Assessment: Condition is at goal. Labs below indicate  that her Vitamin D levels are within the recommended limits of 50-80. She is currently taking OTC Vitamin D 5,000 international units  daily.  Labs were reviewed with pt today and education provided on them and and all questions were answered about them.    Lab Results  Component Value Date   VD25OH 61.9 05/22/2023   VD25OH 53.3 01/02/2022   VD25OH 43.4 07/21/2021   Plan:Continue with OTC Vitamin D supplement.   Weight loss will likely improve availability of vitamin D, thus encouraged Heather Lee to continue with meal plan and their weight loss efforts to further improve this condition.  Thus, we will need to monitor levels regularly (every 3-4 mo on  average) to keep levels within normal limits and prevent over supplementation.   Other hyperlipidemia Assessment: Condition is stable. Labs below indicate that her Cholesterol improved from 175 about 1 yr ago to 163 on 05/22/23. Her HDL is at goal at 60. Labs were reviewed with pt today and all questions were answered about them.    Lab Results  Component Value Date   CHOL 163 05/22/2023   HDL 60 05/22/2023   LDLCALC 94 05/22/2023   TRIG 41 05/22/2023   Plan: Heather Lee agrees to continue with our treatment plan of a heart-heathy, low cholesterol meal plan. We recommend: aerobic activity with eventual goal of a minimum of 150+ min wk plus 2 days/ week of resistance or strength training. We will continue routine screening as patient continues to achieve health goals along their weight loss journey   TREATMENT PLAN FOR OBESITY: Class 2 severe obesity due to excess calories with serious comorbidity and body mass index (BMI) of 36.0 to 36.9 in adult [facility]) Assessment: Heather Lee is here to discuss her progress with her obesity treatment plan along with follow-up of her obesity related diagnoses. See Medical Weight Management Flowsheet for complete bioelectrical impedance results.  Condition is improving, but not optimized.  Biometric data collected today, was reviewed with patient.   Since last office visit on 05/22/23 patient's  Muscle mass has increased by 1 lb. Fat mass has increased by 2.6 lb. Total body water has increased by 4.6 lb.  Counseling done on how various foods will affect these numbers and how to maximize success  Total lbs lost to date: +10 Total weight loss percentage to date: +4.13   Plan: Continue the Category 2 meal plan with breakfast and lunch options   Behavioral Intervention Additional resources provided today: Prediabetes and I.R handout, Benefits of losing 5-10% of one's weight handout.  Evidence-based interventions for health behavior  change were utilized today including the discussion of self monitoring techniques, problem-solving barriers and SMART goal setting techniques.   Regarding patient's less desirable eating habits and patterns, we employed the technique of small changes.  Pt will specifically work on: following the prescribed meal more closely for next visit.    Recommended Physical Activity Goals  Heather Lee has been advised to slowly work up to 150 minutes of moderate intensity aerobic activity a week and strengthening exercises 2-3 times per week for cardiovascular health, weight loss maintenance and preservation of muscle mass.   She has agreed to Continue current level of physical activity   FOLLOW UP: Return in about 3 weeks (around 07/02/2023). She was informed of the importance of frequent follow up visits to maximize her success with intensive lifestyle modifications for her multiple health conditions.  Subjective:   Chief complaint: Obesity Heather Lee is here to discuss her progress  with her obesity treatment plan. She is on the the Category 2 Plan and states she is following her eating plan approximately 50% of the time. She states she is walking 60 minutes 3 days per week.  Interval History:  Heather Lee is here for a follow up office visit.     Since last office visit, Heather Lee endorses that she has been busy with celebratory events and work, therefore she notes doing more off plan eating than normal. She also reports having cravings for carb's and expresses interest in restarting Metformin.   Review of Systems:  Pertinent positives were addressed with patient today.  Reviewed by clinician on day of visit: allergies, medications, problem list, medical history, surgical history, family history, social history, and previous encounter notes.  Weight Summary and Biometrics   Weight Lost Since Last Visit: 0lb  Weight Gained Since Last Visit: 5lb   Vitals Temp: 98.2 F (36.8  C) BP: 112/79 Pulse Rate: 72 SpO2: 97 %   Anthropometric Measurements Height: 5\' 7"  (1.702 m) Weight: 242 lb (109.8 kg) BMI (Calculated): 37.89 Weight at Last Visit: 238lb Weight Lost Since Last Visit: 0lb Weight Gained Since Last Visit: 5lb Starting Weight: 232lb Total Weight Loss (lbs): 6 lb (2.722 kg) Peak Weight: 241lb   Body Composition  Body Fat %: 41.7 % Fat Mass (lbs): 101 lbs Muscle Mass (lbs): 134 lbs Total Body Water (lbs): 83.6 lbs Visceral Fat Rating : 12   Other Clinical Data RMR: [id number] Fasting: yes Labs: no Today's Visit #: 22 Starting Date: 12/28/20   Objective:   PHYSICAL EXAM: Blood pressure 112/79, pulse 72, temperature 98.2 F (36.8 C), height 5\' 7"  (1.702 m), weight 242 lb (109.8 kg), last menstrual period 06/07/2018, SpO2 97 %. Body mass index is 37.9 kg/m.  General: Well Developed, well nourished, and in no acute distress.  HEENT: Normocephalic, atraumatic Skin: Warm and dry, cap RF less 2 sec, good turgor Chest:  Normal excursion, shape, no gross abn Respiratory: speaking in full sentences, no conversational dyspnea NeuroM-Sk: Ambulates w/o assistance, moves * 4 Psych: A and O *3, insight good, mood-full  DIAGNOSTIC DATA REVIEWED:  BMET    Component Value Date/Time   NA 140 05/22/2023 0820   K 4.3 05/22/2023 0820   CL 102 05/22/2023 0820   CO2 24 05/22/2023 0820   GLUCOSE 100 (H) 05/22/2023 0820   GLUCOSE 78 05/27/2021 1500   BUN 9 05/22/2023 0820   CREATININE 0.89 05/22/2023 0820   CALCIUM 9.6 05/22/2023 0820   GFRNONAA >60 05/27/2021 1500   GFRAA 89 12/28/2020 1036   Lab Results  Component Value Date   HGBA1C 5.7 (H) 05/22/2023   HGBA1C 5.7 (H) 12/28/2020   Lab Results  Component Value Date   INSULIN 40.6 (H) 05/22/2023   INSULIN 30.4 (H) 12/28/2020   Lab Results  Component Value Date   TSH 1.650 05/22/2023   CBC    Component Value Date/Time   WBC 4.5 05/22/2023 0820   RBC 5.11 05/22/2023 0820   HGB 13.7  05/22/2023 0820   HCT 42.2 05/22/2023 0820   PLT 337 05/22/2023 0820   MCV 83 05/22/2023 0820   MCH 26.8 05/22/2023 0820   MCHC 32.5 05/22/2023 0820   RDW 13.3 05/22/2023 0820   Iron Studies No results found for: "IRON", "TIBC", "FERRITIN", "IRONPCTSAT" Lipid Panel     Component Value Date/Time   CHOL 163 05/22/2023 0820   TRIG 41 05/22/2023 0820   HDL 60 05/22/2023 0820  LDLCALC 94 05/22/2023 0820   Hepatic Function Panel     Component Value Date/Time   PROT 6.9 05/22/2023 0820   ALBUMIN 4.1 05/22/2023 0820   AST 20 05/22/2023 0820   ALT 21 05/22/2023 0820   ALKPHOS 61 05/22/2023 0820   BILITOT 0.5 05/22/2023 0820      Component Value Date/Time   TSH 1.650 05/22/2023 0820   Nutritional Lab Results  Component Value Date   VD25OH 61.9 05/22/2023   VD25OH 53.3 01/02/2022   VD25OH 43.4 07/21/2021    Attestations:   This encounter took 40 total minutes of time including any pre-visit and post-visit time spent on this date of service, including taking a thorough history, reviewing any labs and/or imaging, reviewing prior notes, as well as documenting in the electronic health record on the date of service. Over 50% of that time was in direct face-to-face counseling and coordinating care for the patient today  I, Special Randolm Idol, acting as a Stage manager for Thomasene Lot, DO., have compiled all relevant documentation for today's office visit on behalf of Thomasene Lot, DO, while in the presence of [employer], DO.  I have reviewed the above documentation for accuracy and completeness, and I agree with the above. Carlye Grippe, D.O.  The 21st Century Cures Act was signed into law in 2016 which includes the topic of electronic health records.  This provides immediate access to information in MyChart.  This includes consultation notes, operative notes, office notes, lab results and pathology reports.  If you have any questions about what you read please let us know  at your next visit so we can discuss your concerns and take corrective action if need be.  We are right here with you.

## 2023-07-10 ENCOUNTER — Other Ambulatory Visit (INDEPENDENT_AMBULATORY_CARE_PROVIDER_SITE_OTHER): Payer: Self-pay | Admitting: Family Medicine

## 2023-07-10 DIAGNOSIS — R7303 Prediabetes: Secondary | ICD-10-CM

## 2023-07-11 ENCOUNTER — Ambulatory Visit (INDEPENDENT_AMBULATORY_CARE_PROVIDER_SITE_OTHER): Payer: BC Managed Care – PPO | Admitting: Adult Health

## 2023-08-23 ENCOUNTER — Other Ambulatory Visit: Payer: Self-pay | Admitting: Internal Medicine

## 2023-08-23 DIAGNOSIS — R2232 Localized swelling, mass and lump, left upper limb: Secondary | ICD-10-CM

## 2023-09-05 IMAGING — DX DG SHOULDER 2+V*R*
3 series · 3 of 3 positions shown · non-contrast
Comparison: None.

CLINICAL DATA: Acute pain of right shoulder.

EXAM:
RIGHT SHOULDER - 2+ VIEW

[dg shoulder right (1 of 3)]
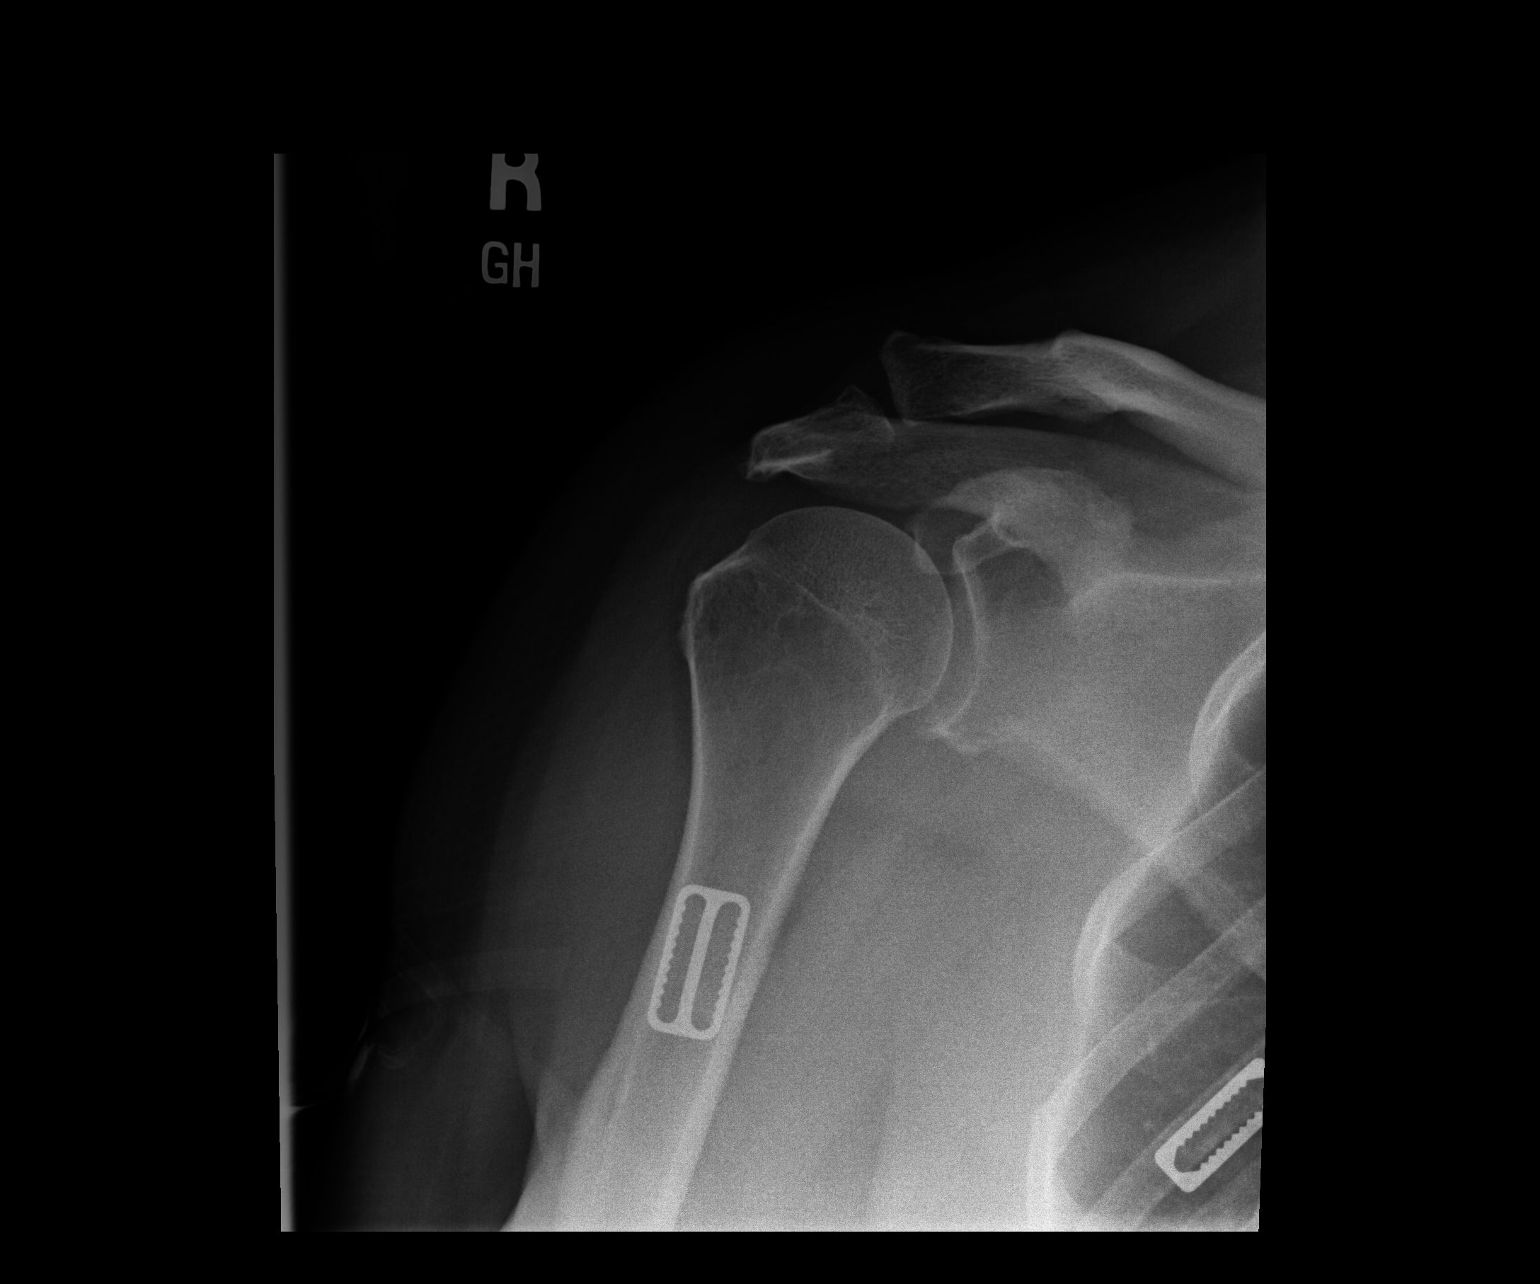

[dg shoulder right (2 of 3)]
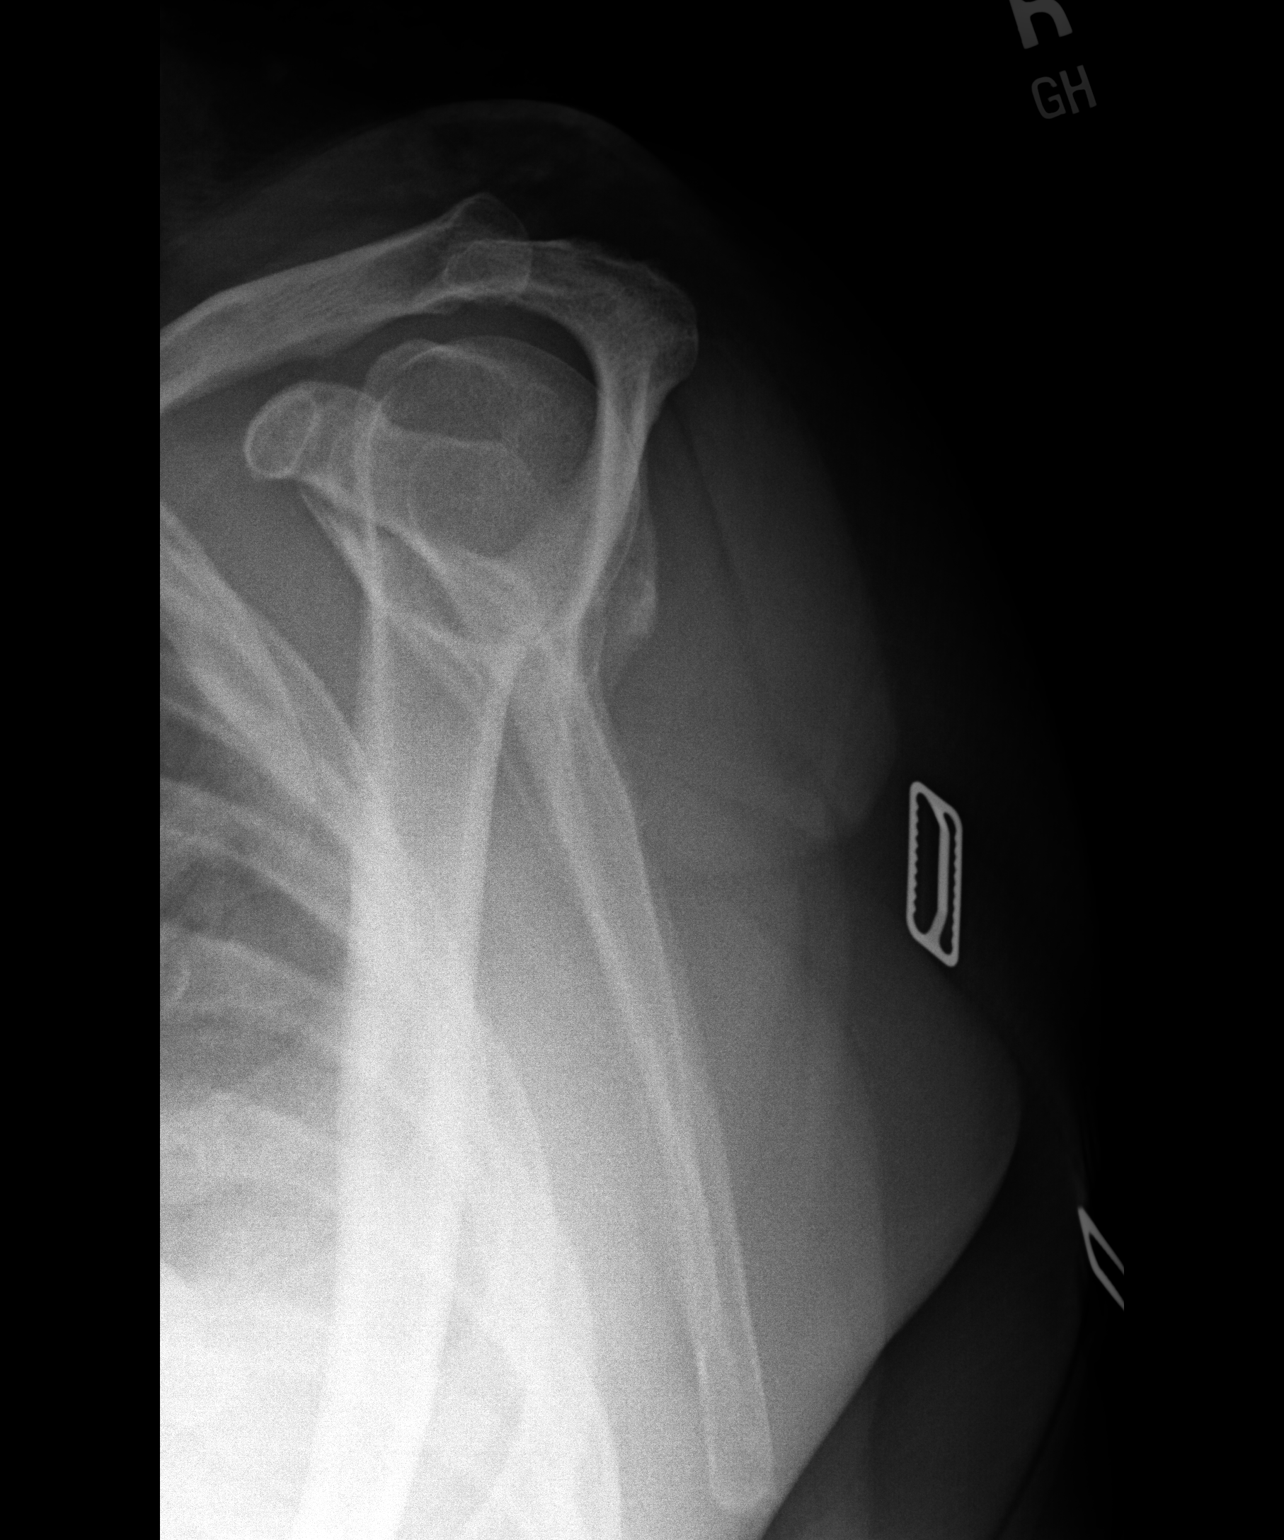

[dg shoulder right (3 of 3)]
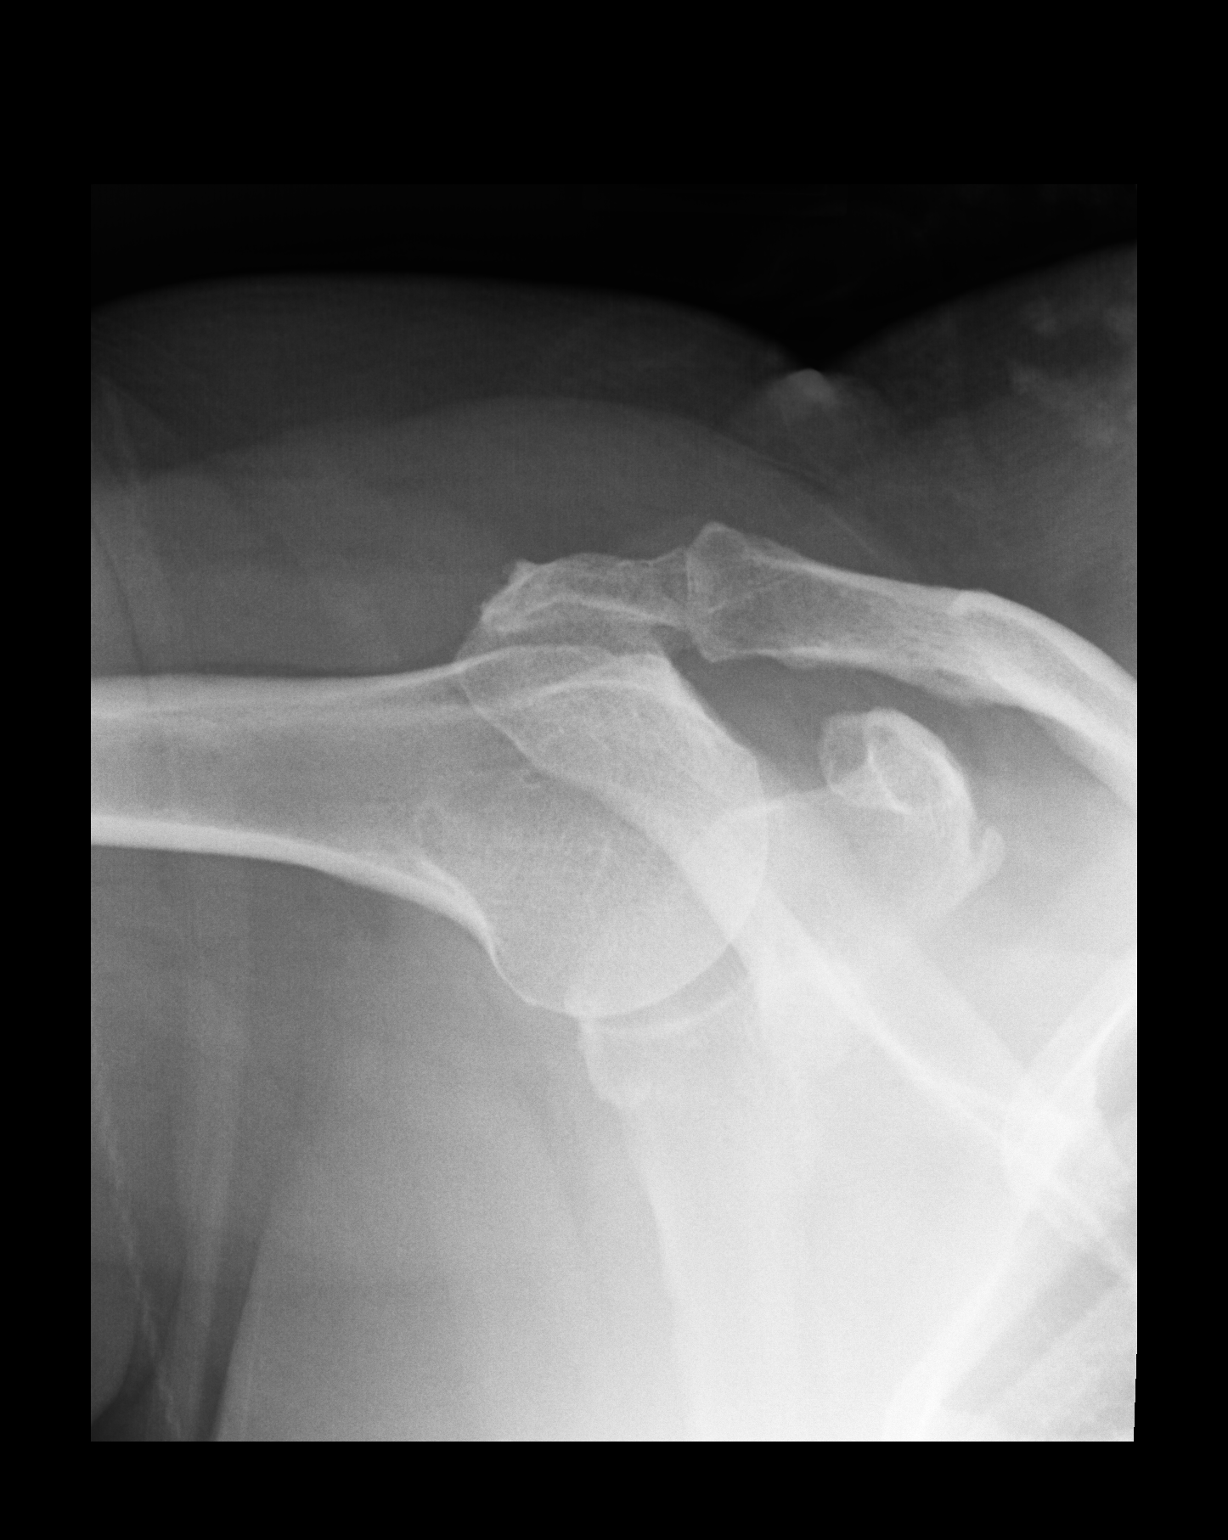

[3 of 3 positions shown; findings below may reference images not displayed]

FINDINGS: There is mildly decreased bone mineralization. Mild inferior glenoid
degenerative osteophytosis. Mild inferior acromioclavicular joint
space narrowing and mild peripheral degenerative osteophytosis.

Mild distal lateral subacromial spurring and mild lateral
downsloping.

No acute fracture or dislocation.
IMPRESSION: :
IMPRESSION: *Mild acromioclavicular and glenohumeral osteoarthritis.
*Mild acromial lateral downsloping and subacromial spurring.

## 2023-12-18 ENCOUNTER — Encounter (HOSPITAL_COMMUNITY): Payer: Self-pay | Admitting: Emergency Medicine

## 2023-12-18 ENCOUNTER — Ambulatory Visit (HOSPITAL_COMMUNITY)
Admission: EM | Admit: 2023-12-18 | Discharge: 2023-12-18 | Disposition: A | Discharge status: Home / Self Care | Payer: BC Managed Care – PPO | Attending: Emergency Medicine | Admitting: Emergency Medicine

## 2023-12-18 DIAGNOSIS — H66001 Acute suppurative otitis media without spontaneous rupture of ear drum, right ear: Secondary | ICD-10-CM

## 2023-12-18 DIAGNOSIS — B9789 Other viral agents as the cause of diseases classified elsewhere: Secondary | ICD-10-CM | POA: Diagnosis not present

## 2023-12-18 DIAGNOSIS — Z1152 Encounter for screening for COVID-19: Secondary | ICD-10-CM | POA: Insufficient documentation

## 2023-12-18 DIAGNOSIS — R059 Cough, unspecified: Secondary | ICD-10-CM | POA: Insufficient documentation

## 2023-12-18 DIAGNOSIS — J069 Acute upper respiratory infection, unspecified: Secondary | ICD-10-CM | POA: Diagnosis not present

## 2023-12-18 LAB — SARS CORONAVIRUS 2 (TAT 6-24 HRS): SARS Coronavirus 2: NEGATIVE

## 2023-12-18 MED ORDER — CEFDINIR 300 MG PO CAPS: 300.0000 mg | ORAL_CAPSULE | Freq: Two times a day (BID) | ORAL | 0 refills | Status: AC

## 2023-12-18 MED ORDER — PAXLOVID (300/100) 20 X 150 MG & 10 X 100MG PO TBPK: 3.0000 | ORAL_TABLET | Freq: Two times a day (BID) | ORAL | 0 refills | Status: AC

## 2023-12-18 MED ORDER — IPRATROPIUM BROMIDE 0.06 % NA SOLN: 2.0000 | Freq: Three times a day (TID) | NASAL | 1 refills | Status: AC

## 2023-12-18 MED ORDER — CETIRIZINE HCL 10 MG PO TABS: 10.0000 mg | ORAL_TABLET | Freq: Every day | ORAL | 1 refills | Status: AC

## 2023-12-18 NOTE — Discharge Instructions (Addendum)
You received a COVID-19 test today.  The result of your COVID-19 test will be posted to your MyChart once it is complete, typically this takes 6 to 12 hours.      If your COVID-19 test is positive, you will be contacted by phone.  Please discuss with the callback nurse whether or not you would benefit from antiviral therapy treatment for COVID-19.  Please read below to learn more about the medications, dosages and frequencies that I recommend to help alleviate your symptoms and to get you feeling better soon:   Omnicef (cefdinir):  Please take one (1) dose twice daily for 10 days.  This antibiotic can cause upset stomach, this will resolve once antibiotics are complete.  You are welcome to take a probiotic, eat yogurt, take Imodium while taking this medication.  Please avoid other systemic medications such as Maalox, Pepto-Bismol or milk of magnesia as they can interfere with the body's ability to absorb the antibiotics.   Zyrtec (cetirizine): This is an excellent second-generation antihistamine that helps to reduce respiratory inflammatory response to environmental allergens.  In some patients, this medication can cause daytime sleepiness so I recommend that you take 1 tablet daily at bedtime.     Atrovent (ipratropium): This is an excellent nasal decongestant spray that does not cause rebound congestion.  Please instill 2 sprays into each nare with each use, you may use this up to 3 times daily.  Once you find that you are forgetting to use the spray more often that you remember to use it, you will know that you no longer need it. Chloraseptic Throat Spray: Spray 5 sprays into affected area every 2 hours, hold for 15 seconds and either swallow or spit it out.  This is a excellent numbing medication because it is a spray, you can put it right where you needed and so sucking on a lozenge and numbing your entire mouth.      If symptoms have not meaningfully improved in the next 5 to 7 days, please return  for repeat evaluation or follow-up with your regular provider.  If symptoms have worsened in the next 3 to 5 days, please return for repeat evaluation or follow-up with your regular provider.    Thank you for visiting urgent care today.  We appreciate the opportunity to participate in your care.

## 2023-12-18 NOTE — ED Provider Notes (Addendum)
[facility]    CSN: [id number] Arrival date & time: 12/18/23  9562    HISTORY   Chief Complaint  Patient presents with   Cough   Sore Throat   HPI Heather Lee is a pleasant, 52 y.o. female who presents to urgent care today. Patient complains of a 4-day history of sore, scratchy throat, nonproductive cough, nasal congestion, rhinorrhea and sneezing.  States she is also noticed that she has a lot of crackling sounds in her ears.  Patient denies fever, body aches, chills, nausea, vomiting, diarrhea, known sick contacts.  Patient has elevated blood pressure and heart rate on arrival, vital signs are otherwise normal, states has been taking Coricidin without meaningful relief of her symptoms.    The history is provided by the patient.   Past Medical History:  Diagnosis Date   Anemia    Anxiety    Back pain    Bilateral swelling of feet    Depression    Fatigue    Hypertension    Lactose intolerance    Leg pain    Lower back pain    Poor circulation of extremity    feet   Poor memory    Pre-diabetes    Rheumatoid arthritis (HCC)    Sebaceous cyst of breast, left    Shortness of breath on exertion    Thyromegaly    Patient Active Problem List   Diagnosis Date Noted   Other hyperlipidemia 05/22/2023   Fatigue 05/22/2023   Obesity- Start BMI 36.34 12/26/20 05/22/2023   BMI 37.0-37.9, adult Current BMI 37.3 05/22/2023   Depression 11/07/2022   Pre-diabetes 10/24/2022   Left lumbar radiculopathy 08/08/2022   At risk for impaired metabolic function 05/16/2021   Vitamin D deficiency 03/23/2021   At risk for diabetes mellitus 03/23/2021   Depressed mood, with emotional eating 03/23/2021   Anemia 02/07/2021   Goiter 02/07/2021   Essential hypertension 02/07/2021   Class 2 severe obesity with serious comorbidity and body mass index (BMI) of 36.0 to 36.9 in adult [facility]) 01/27/2021   Other constipation 01/26/2021   Prediabetes 01/26/2021   Dizziness  12/01/2020   Other allergic rhinitis 12/01/2020   Past Surgical History:  Procedure Laterality Date   BACK SURGERY     BREAST CYST EXCISION Left 07/02/2018   Procedure: LEFT BREAST SEBACEOUS CYST EXCISION;  Surgeon: [name], MD;  Location: [facility];  Service: General;  Laterality: Left;   BREAST CYST EXCISION Left 06/07/2021   Procedure: LEFT BREAST SEBACEOUS CYST EXCISION;  Surgeon: [name], MD;  Location: [facility];  Service: General;  Laterality: Left;   HYSTEROTOMY  08/31/2018   OB History     Gravida  3   Para  3   Term  3   Preterm      AB      Living  3      SAB      IAB      Ectopic      Multiple      Live Births             Home Medications    Prior to Admission medications   Medication Sig Start Date End Date Taking? Authorizing Provider  COVID-19 mRNA vaccine 2023-2024 (COMIRNATY) SUSP injection Inject into the muscle. 10/13/22   Judyann Munson, MD  desvenlafaxine (PRISTIQ) 50 MG 24 hr tablet Take 50 mg by mouth daily. Patient not taking: Reported on 12/18/2023 11/08/20  [provider]  gabapentin (NEURONTIN) 300 MG capsule Take 1 capsule (300 mg total) by mouth 3 (three) times daily. Patient not taking: Reported on 12/18/2023 08/08/22   Levert Feinstein, MD  metFORMIN (GLUCOPHAGE) 500 MG tablet 1 po with lunch daily Patient not taking: Reported on 12/18/2023 06/11/23   Thomasene Lot, DO  Multiple Vitamin (MULTIVITAMIN WITH MINERALS) TABS tablet Take 1 tablet by mouth daily.    [provider]  olmesartan (BENICAR) 20 MG tablet olmesartan 20 mg tablet  TAKE 1 TABLET BY MOUTH EVERY DAY    [provider]  PARoxetine (PAXIL-CR) 12.5 MG 24 hr tablet Take 1 tablet by mouth daily.    [provider]    Family History Family History  Problem Relation Age of Onset   Hypertension Mother    Anxiety disorder Mother    Hypertension Father    Stroke Father    Kidney  disease Father    Social History Social History   Tobacco Use   Smoking status: Never   Smokeless tobacco: Never  Vaping Use   Vaping status: Never Used  Substance Use Topics   Alcohol use: Yes    Comment: social   Drug use: Never   Allergies   Ace inhibitors, Amlodipine, Diclofenac, Ibuprofen, and Penicillins  Review of Systems Review of Systems Pertinent findings revealed after performing a 14 point review of systems has been noted in the history of present illness.  Physical Exam Vital Signs BP (!) 141/82 (BP Location: Left Arm)   Pulse (!) 106   Temp 98 F (36.7 C) (Oral)   Resp 19   LMP 06/07/2018   SpO2 96%   No data found.  Physical Exam Vitals and nursing note reviewed.  Constitutional:      General: She is awake. She is not in acute distress.    Appearance: Normal appearance. She is well-developed and well-groomed. She is not ill-appearing.  HENT:     Head: Normocephalic and atraumatic.     Salivary Glands: Right salivary gland is not diffusely enlarged or tender. Left salivary gland is not diffusely enlarged or tender.     Right Ear: Hearing, ear canal and external ear normal. No drainage. A middle ear effusion is present. There is no impacted cerumen. Tympanic membrane is injected and erythematous. Tympanic membrane is not bulging.     Left Ear: Hearing, tympanic membrane, ear canal and external ear normal. No drainage.  No middle ear effusion. There is no impacted cerumen. Tympanic membrane is not erythematous or bulging.     Nose: Mucosal edema and rhinorrhea present. No nasal deformity, septal deviation or congestion. Rhinorrhea is clear.     Right Turbinates: Not enlarged, swollen or pale.     Left Turbinates: Not enlarged, swollen or pale.     Right Sinus: No maxillary sinus tenderness or frontal sinus tenderness.     Left Sinus: No maxillary sinus tenderness or frontal sinus tenderness.     Mouth/Throat:     Lips: Pink. No lesions.     Mouth: Mucous  membranes are moist. No oral lesions.     Pharynx: Oropharynx is clear. Uvula midline. Postnasal drip present. No pharyngeal swelling, oropharyngeal exudate, posterior oropharyngeal erythema or uvula swelling.     Tonsils: No tonsillar exudate. 0 on the right. 0 on the left.  Eyes:     General: Lids are normal.        Right eye: No discharge.        Left  eye: No discharge.     Extraocular Movements: Extraocular movements intact.     Conjunctiva/sclera: Conjunctivae normal.     Right eye: Right conjunctiva is not injected.     Left eye: Left conjunctiva is not injected.  Neck:     Trachea: Trachea and phonation normal.  Cardiovascular:     Rate and Rhythm: Normal rate and regular rhythm.     Pulses: Normal pulses.     Heart sounds: Normal heart sounds. No murmur heard.    No friction rub. No gallop.  Pulmonary:     Effort: Pulmonary effort is normal. No accessory muscle usage, prolonged expiration or respiratory distress.     Breath sounds: Normal breath sounds and air entry. No stridor, decreased air movement or transmitted upper airway sounds. No decreased breath sounds, wheezing, rhonchi or rales.  Chest:     Chest wall: No tenderness.  Musculoskeletal:        General: Normal range of motion.     Cervical back: Full passive range of motion without pain, normal range of motion and neck supple. Normal range of motion.  Lymphadenopathy:     Cervical: No cervical adenopathy.  Skin:    General: Skin is warm and dry.     Findings: No erythema or rash.  Neurological:     General: No focal deficit present.     Mental Status: She is alert and oriented to person, place, and time.  Psychiatric:        Mood and Affect: Mood normal.        Behavior: Behavior normal. Behavior is cooperative.     Visual Acuity Right Eye Distance:   Left Eye Distance:   Bilateral Distance:    Right Eye Near:   Left Eye Near:    Bilateral Near:     UC Couse / Diagnostics / Procedures:      Radiology No results found.  Procedures Procedures (including critical care time) EKG  Pending results:  Labs Reviewed  SARS CORONAVIRUS 2 (TAT 6-24 HRS)    Medications Ordered in UC: Medications - No data to display  UC Diagnoses / Final Clinical Impressions(s)   I have reviewed the triage vital signs and the nursing notes.  Pertinent labs & imaging results that were available during my care of the patient were reviewed by me and considered in my medical decision making (see chart for details).    Final diagnoses:  Acute suppurative otitis media of right ear without spontaneous rupture of tympanic membrane, recurrence not specified  Viral URI with cough   Patient advised physical exam findings concerning for affection and right inner ear.  Patient provided with today course of cefdinir for empiric treatment.  COVID-19 testing performed at patient's request, if positive, patient has a prescription for Paxlovid to pick up and take, prescription sent prior to COVID-19 test result due to early closure for the holidays.  Patient verbalized understanding that she is not to pick up Paxlovid and take it unless her COVID-19 test is positive, patient states she does have access to her MyChart.  Supportive medications discussed.  Conservative care recommended.  Return precautions advised.  Please see discharge instructions below for details of plan of care as provided to patient. ED Prescriptions     Medication Sig Dispense Auth. Provider   cefdinir (OMNICEF) 300 MG capsule Take 1 capsule (300 mg total) by mouth 2 (two) times daily for 10 days. 20 capsule Theadora Rama Scales, PA-C   ipratropium (ATROVENT) 0.06 %  nasal spray Place 2 sprays into both nostrils 3 (three) times daily. As needed for nasal congestion, runny nose 15 mL Theadora Rama Scales, PA-C   cetirizine (ZYRTEC ALLERGY) 10 MG tablet Take 1 tablet (10 mg total) by mouth at bedtime. 90 tablet Theadora Rama Scales, PA-C    nirmatrelvir/ritonavir (PAXLOVID, 300/100,) 20 x 150 MG & 10 x 100MG  TBPK Take 3 tablets by mouth 2 (two) times daily for 5 days. Patient GFR is 77. 30 tablet Theadora Rama Scales, PA-C      PDMP not reviewed this encounter.  Pending results:  Labs Reviewed  SARS CORONAVIRUS 2 (TAT 6-24 HRS)      Discharge Instructions      You received a COVID-19 test today.  The result of your COVID-19 test will be posted to your MyChart once it is complete, typically this takes 6 to 12 hours.      If your COVID-19 test is positive, you will be contacted by phone.  Please discuss with the callback nurse whether or not you would benefit from antiviral therapy treatment for COVID-19.  Please read below to learn more about the medications, dosages and frequencies that I recommend to help alleviate your symptoms and to get you feeling better soon:   Omnicef (cefdinir):  Please take one (1) dose twice daily for 10 days.  This antibiotic can cause upset stomach, this will resolve once antibiotics are complete.  You are welcome to take a probiotic, eat yogurt, take Imodium while taking this medication.  Please avoid other systemic medications such as Maalox, Pepto-Bismol or milk of magnesia as they can interfere with the body's ability to absorb the antibiotics.   Zyrtec (cetirizine): This is an excellent second-generation antihistamine that helps to reduce respiratory inflammatory response to environmental allergens.  In some patients, this medication can cause daytime sleepiness so I recommend that you take 1 tablet daily at bedtime.     Atrovent (ipratropium): This is an excellent nasal decongestant spray that does not cause rebound congestion.  Please instill 2 sprays into each nare with each use, you may use this up to 3 times daily.  Once you find that you are forgetting to use the spray more often that you remember to use it, you will know that you no longer need it. Chloraseptic Throat Spray: Spray 5  sprays into affected area every 2 hours, hold for 15 seconds and either swallow or spit it out.  This is a excellent numbing medication because it is a spray, you can put it right where you needed and so sucking on a lozenge and numbing your entire mouth.      If symptoms have not meaningfully improved in the next 5 to 7 days, please return for repeat evaluation or follow-up with your regular provider.  If symptoms have worsened in the next 3 to 5 days, please return for repeat evaluation or follow-up with your regular provider.    Thank you for visiting urgent care today.  We appreciate the opportunity to participate in your care.       Disposition Upon Discharge:  Condition: stable for discharge home  Patient presented with an acute illness with associated systemic symptoms and significant discomfort requiring urgent management. In my opinion, this is a condition that a prudent lay person (someone who possesses an average knowledge of health and medicine) may potentially expect to result in complications if not addressed urgently such as respiratory distress, impairment of bodily function or dysfunction of bodily organs.  Routine symptom specific, illness specific and/or disease specific instructions were discussed with the patient and/or caregiver at length.   As such, the patient has been evaluated and assessed, work-up was performed and treatment was provided in alignment with urgent care protocols and evidence based medicine.  Patient/parent/caregiver has been advised that the patient may require follow up for further testing and treatment if the symptoms continue in spite of treatment, as clinically indicated and appropriate.  Patient/parent/caregiver has been advised to return to the [facility] or PCP if no better; to PCP or the Emergency Department if new signs and symptoms develop, or if the current signs or symptoms continue to change or worsen for further workup, evaluation and treatment as  clinically indicated and appropriate  The patient will follow up with their current PCP if and as advised. If the patient does not currently have a PCP we will assist them in obtaining one.   The patient may need specialty follow up if the symptoms continue, in spite of conservative treatment and management, for further workup, evaluation, consultation and treatment as clinically indicated and appropriate.  Patient/parent/caregiver verbalized understanding and agreement of plan as discussed.  All questions were addressed during visit.  Please see discharge instructions below for further details of plan.  This office note has been dictated using Teaching laboratory technician.  Unfortunately, this method of dictation can sometimes lead to typographical or grammatical errors.  I apologize for your inconvenience in advance if this occurs.  Please do not hesitate to reach out to me if clarification is needed.      Theadora Rama Scales, PA-C 12/18/23 1006    Theadora Rama [place], [place] 12/18/23 1026

## 2023-12-18 NOTE — ED Triage Notes (Signed)
Pt c/o sore throat, cough, mucous, and sneezing since Friday.

## 2025-01-26 ENCOUNTER — Encounter (INDEPENDENT_AMBULATORY_CARE_PROVIDER_SITE_OTHER): Payer: Self-pay | Admitting: Ophthalmology

## 2025-02-18 ENCOUNTER — Encounter (INDEPENDENT_AMBULATORY_CARE_PROVIDER_SITE_OTHER): Admitting: Ophthalmology
